# Patient Record
Sex: Male | Born: 1950
Health system: Southern US, Community
[De-identification: ages and names within clinical notes are randomized; demographics above are authoritative.]

## PROBLEM LIST (undated history)

## (undated) DIAGNOSIS — N201 Calculus of ureter: Secondary | ICD-10-CM

## (undated) DIAGNOSIS — Z87442 Personal history of urinary calculi: Secondary | ICD-10-CM

## (undated) DIAGNOSIS — I10 Essential (primary) hypertension: Secondary | ICD-10-CM

## (undated) DIAGNOSIS — N200 Calculus of kidney: Secondary | ICD-10-CM

## (undated) DIAGNOSIS — K759 Inflammatory liver disease, unspecified: Secondary | ICD-10-CM

## (undated) DIAGNOSIS — K5792 Diverticulitis of intestine, part unspecified, without perforation or abscess without bleeding: Secondary | ICD-10-CM

## (undated) DIAGNOSIS — E785 Hyperlipidemia, unspecified: Secondary | ICD-10-CM

## (undated) DIAGNOSIS — C801 Malignant (primary) neoplasm, unspecified: Secondary | ICD-10-CM

## (undated) HISTORY — PX: TONSILLECTOMY: SUR1361

## (undated) HISTORY — PX: EXTRACORPOREAL SHOCK WAVE LITHOTRIPSY: SHX1557

---

## 2002-04-30 DIAGNOSIS — C4492 Squamous cell carcinoma of skin, unspecified: Secondary | ICD-10-CM

## 2002-04-30 HISTORY — DX: Squamous cell carcinoma of skin, unspecified: C44.92

## 2004-10-25 ENCOUNTER — Ambulatory Visit (HOSPITAL_COMMUNITY): Admission: RE | Admit: 2004-10-25 | Discharge: 2004-10-25 | Payer: Self-pay | Admitting: Family Medicine

## 2006-09-04 ENCOUNTER — Encounter (INDEPENDENT_AMBULATORY_CARE_PROVIDER_SITE_OTHER): Payer: Self-pay | Admitting: Surgery

## 2006-09-05 ENCOUNTER — Ambulatory Visit (HOSPITAL_COMMUNITY): Admission: RE | Admit: 2006-09-05 | Discharge: 2006-09-05 | Payer: Self-pay | Admitting: Surgery

## 2006-09-05 HISTORY — PX: LAPAROSCOPIC CHOLECYSTECTOMY: SUR755

## 2006-10-09 DIAGNOSIS — C4491 Basal cell carcinoma of skin, unspecified: Secondary | ICD-10-CM

## 2006-10-09 HISTORY — DX: Basal cell carcinoma of skin, unspecified: C44.91

## 2006-10-24 ENCOUNTER — Ambulatory Visit (HOSPITAL_COMMUNITY): Admission: RE | Admit: 2006-10-24 | Discharge: 2006-10-24 | Payer: Self-pay | Admitting: Urology

## 2006-11-25 DIAGNOSIS — C4491 Basal cell carcinoma of skin, unspecified: Secondary | ICD-10-CM

## 2006-11-25 HISTORY — DX: Basal cell carcinoma of skin, unspecified: C44.91

## 2009-09-26 DIAGNOSIS — C4491 Basal cell carcinoma of skin, unspecified: Secondary | ICD-10-CM

## 2009-09-26 HISTORY — DX: Basal cell carcinoma of skin, unspecified: C44.91

## 2010-07-11 NOTE — Op Note (Signed)
NAME:  Gary Hurst, Gary Hurst             ACCOUNT NO.:  [id number]   MEDICAL RECORD NO.:  [id number]          PATIENT TYPE:  AMB   LOCATION:  SDS                          FACILITY:  [facility]   PHYSICIAN:  Wilmon Arms. Corliss Skains, M.D. DATE OF BIRTH:  Dec 15, 1950   DATE OF PROCEDURE:  09/05/2006  DATE OF DISCHARGE:                               OPERATIVE REPORT   PREOPERATIVE DIAGNOSIS:  Chronic cholecystitis with biliary colic.   POSTOPERATIVE DIAGNOSIS:  Chronic cholecystitis with biliary colic.   PROCEDURE PERFORMED:  Laparoscopic cholecystectomy with intraoperative  cholangiogram.   SURGEON:  Wilmon Arms. Corliss Skains, M.D.   ASSISTANT:  Velora Heckler, MD   ANESTHESIA:  General endotracheal.   INDICATIONS:  The patient is a 60 year old male who presents with a one  month history of right upper quadrant pain.  This has become more  severe, more constant over time.  It is located in his right upper  quadrant  and radiates through to his back.  He has some nausea,  bloating and belching.  An ultrasound showed no gallbladder wall  thickening but did show some gallbladder sludge.  He had normal liver  functions, amylase and lipase.  He was examined the office and it was  felt that his symptoms were due to his gallbladder.  We recommended  laparoscopic cholecystectomy.   DESCRIPTION OF PROCEDURE:  The patient is brought to the operating room,  placed in supine position on the operating room table.  After an  adequate level of general anesthesia was obtained, the patient's abdomen  was shaved, prepped with Betadine and draped in sterile fashion.  Time-  out was taken assure proper patient, proper procedure.  The area around  the umbilicus was infiltrated with 0.25% Marcaine.  A transverse  incision was made below the umbilicus.  Dissection was carried down to  the fascia which was opened vertically.  The peritoneum was bluntly  entered.  A stay suture of Vicryl was placed around the fascial opening.  The  Hasson cannula was inserted and secured with a stay suture.  Pneumoperitoneum is obtained by insufflating CO2, maintaining maximal  pressure of 15 mm/Hg.  The laparoscope was inserted and the patient was  positioned in reverse Trendelenburg and tilted to his left.  A 10 mm  port was placed in the subxiphoid position.  Two 5 mm ports were placed  in right upper quadrant.  The gallbladder was grasped with a clamp,  elevated over the edge of the liver.  We opened the peritoneum around  the hilum of the gallbladder.  The cystic duct was identified.  We  circumferentially dissected around the duct.  A clip was placed distally  on the duct.  The cystic duct was opened with scissors.  A Cook  cholangiogram catheter was inserted through a stab incision and threaded  into the cystic duct.  It was secured with a clip.  A cholangiogram was  obtained.  This showed good flow proximally and distally in the biliary  tree with no sign of obstruction.  Contrast flowed easily into the  duodenum.  The catheter was then removed.  The cystic duct was ligated  with clips and divided.  The cystic artery had two branches and both  were ligated with clips and divided.  Cautery was then used to remove  the gallbladder from the liver bed.  The gallbladder was placed in an  EndoCatch sac.  We irrigated the gallbladder fossa.  Hemostasis was  obtained with cautery.  The gallbladder was then removed through the  umbilical port site.  The fascia was closed with the stay suture.  Pneumoperitoneum is then released as the remainder of the  ports were removed.  4-0 Monocryl was used to close all skin incisions.  Steri-Strips and clean dressings were applied.  The patient was then  extubated and brought to recovery room in stable condition.  All sponge,  instrument, needle counts correct.      Wilmon Arms. Tsuei, M.D.  Electronically Signed     MKT/MEDQ  D:  09/05/2006  T:  09/05/2006  Job:  [id number]   cc:   Sigmund Hazel, M.D.

## 2010-12-08 LAB — CBC
HCT: 43.9
Hemoglobin: 15.5
MCHC: 35.2
MCV: 87.5
Platelets: 216
RBC: 5.02
RDW: 13.2

## 2010-12-08 LAB — BASIC METABOLIC PANEL
Potassium: 4
Sodium: 136

## 2010-12-12 LAB — COMPREHENSIVE METABOLIC PANEL
AST: 19
BUN: 13
CO2: 29
Chloride: 100
GFR calc Af Amer: >60
Sodium: 137
Total Bilirubin: 1.1

## 2010-12-12 LAB — DIFFERENTIAL
Eosinophils Absolute: 0.1
Eosinophils Relative: 1
Monocytes Absolute: 0.6
Neutro Abs: 4.2
Neutrophils Relative %: 52

## 2010-12-12 LAB — CBC
HCT: 42.8
Hemoglobin: 14.7
MCV: 88.9
Platelets: 198
RBC: 4.82
RDW: 12.7

## 2011-01-25 ENCOUNTER — Other Ambulatory Visit: Payer: Self-pay | Admitting: Dermatology

## 2012-04-18 ENCOUNTER — Telehealth: Payer: Self-pay | Admitting: *Deleted

## 2012-04-18 NOTE — Telephone Encounter (Signed)
Erroneous encounter

## 2012-06-18 ENCOUNTER — Other Ambulatory Visit: Payer: Self-pay | Admitting: Dermatology

## 2012-06-18 DIAGNOSIS — C4492 Squamous cell carcinoma of skin, unspecified: Secondary | ICD-10-CM

## 2012-06-18 HISTORY — DX: Squamous cell carcinoma of skin, unspecified: C44.92

## 2012-07-28 ENCOUNTER — Other Ambulatory Visit: Payer: Self-pay | Admitting: Dermatology

## 2012-08-29 ENCOUNTER — Emergency Department (HOSPITAL_COMMUNITY): Payer: BC Managed Care – PPO

## 2012-08-29 ENCOUNTER — Encounter (HOSPITAL_COMMUNITY): Payer: Self-pay

## 2012-08-29 ENCOUNTER — Emergency Department (HOSPITAL_COMMUNITY)
Admission: EM | Admit: 2012-08-29 | Discharge: 2012-08-29 | Disposition: A | Discharge status: Home / Self Care | Payer: BC Managed Care – PPO | Attending: Emergency Medicine | Admitting: Emergency Medicine

## 2012-08-29 DIAGNOSIS — Z87891 Personal history of nicotine dependence: Secondary | ICD-10-CM | POA: Insufficient documentation

## 2012-08-29 DIAGNOSIS — Z7982 Long term (current) use of aspirin: Secondary | ICD-10-CM | POA: Insufficient documentation

## 2012-08-29 DIAGNOSIS — I10 Essential (primary) hypertension: Secondary | ICD-10-CM | POA: Insufficient documentation

## 2012-08-29 DIAGNOSIS — N200 Calculus of kidney: Secondary | ICD-10-CM

## 2012-08-29 DIAGNOSIS — E785 Hyperlipidemia, unspecified: Secondary | ICD-10-CM | POA: Insufficient documentation

## 2012-08-29 DIAGNOSIS — Z79899 Other long term (current) drug therapy: Secondary | ICD-10-CM | POA: Insufficient documentation

## 2012-08-29 DIAGNOSIS — Z87442 Personal history of urinary calculi: Secondary | ICD-10-CM | POA: Insufficient documentation

## 2012-08-29 HISTORY — DX: Essential (primary) hypertension: I10

## 2012-08-29 LAB — POCT I-STAT, CHEM 8
BUN: 19 mg/dL (ref 6–23)
Calcium, Ion: 1.14 mmol/L (ref 1.12–1.23)
Chloride: 102 mEq/L (ref 96–112)
Creatinine, Ser: 1.1 mg/dL (ref 0.50–1.35)
Glucose, Bld: 96 mg/dL (ref 70–99)
Potassium: 4.1 mEq/L (ref 3.5–5.1)
TCO2: 23 mmol/L (ref 0–100)

## 2012-08-29 LAB — URINALYSIS, ROUTINE W REFLEX MICROSCOPIC
Ketones, ur: NEGATIVE mg/dL
Nitrite: NEGATIVE
Protein, ur: NEGATIVE mg/dL
Specific Gravity, Urine: 1.027 (ref 1.005–1.030)
Urobilinogen, UA: 1 mg/dL (ref 0.0–1.0)
pH: 6 (ref 5.0–8.0)

## 2012-08-29 LAB — URINE MICROSCOPIC-ADD ON

## 2012-08-29 MED ORDER — ONDANSETRON HCL 4 MG PO TABS: 4.0000 mg | ORAL_TABLET | Freq: Four times a day (QID) | ORAL | Status: DC

## 2012-08-29 MED ORDER — TAMSULOSIN HCL 0.4 MG PO CAPS: 0.4000 mg | ORAL_CAPSULE | Freq: Every day | ORAL | Status: DC

## 2012-08-29 MED ORDER — OXYCODONE-ACETAMINOPHEN 5-325 MG PO TABS: 2.0000 | ORAL_TABLET | ORAL | Status: DC | PRN

## 2012-08-29 MED ORDER — OXYCODONE-ACETAMINOPHEN 5-325 MG PO TABS
1.0000 | ORAL_TABLET | Freq: Once | ORAL | Status: AC
  Administered 2012-08-29: 1 via ORAL
  Filled 2012-08-29: qty 1

## 2012-08-29 NOTE — ED Notes (Signed)
Pt aware of the need for urine sample.  Urinal at bedside

## 2012-08-29 NOTE — Consult Note (Signed)
Reason for Consult: Rt Ureteral Stone, Bilateral Intrarenal Stones, Left Renal Cyst  Referring Physician: Laveda Hurst, [place]  Gary Hurst is an 62 y.o. male.  HPI:   1 - Rt Ureteral Stone, Bilateral Renal Stones - Pt with Rt mid ureteral stone (7mm, 1200HU, SSD 13cm) as well as Rt>Lt intrarenal stones by CT in ER 7/4 on work-up of colicky rt flank pain. Has had numerous prior stones managed with medical therapy x 5 and lithotripsy x 2 previously. He is on K-Cit BID and drinks at least 1 gallon of water daily and has followed previously with Sabino Gasser in [facility].   No fever / chills / nausea / vomitting. Pain now well controlled in ER. UA without infectious parameters.  2 - Left Renal Cyst - 2.5cm left mid lateral renal cyst incidental on CT stone study 08/29/12. Questionable wall thickening on severel cuts, no prior contrast renal imaging.   3 - Prostate Screening - Pt's father with prostate cancer treated with brachytherapy. Pt gets annual DRE and PSA per PCP and normal per report.  Pt's PCP is Gary Gosselin MD at [facility] [place].  Today Gary Hurst is seen as ER consult for above.   Past Medical History  Diagnosis Date  . Kidney stones   . Kidney stones   . Hypertension   . High cholesterol     Past Surgical History  Procedure Laterality Date  . Cholecystectomy      Family History  Problem Relation Age of Onset  . Parkinson's disease Mother   . Hypertension Mother   . Hyperlipidemia Mother   . Kidney Stones Father   . Alzheimer's disease Father   . Kidney Stones Sister   . Kidney Stones Brother     Social History:  reports that he has quit smoking. He has never used smokeless tobacco. He reports that he does not drink alcohol or use illicit drugs.  Allergies:  Allergies  Allergen Reactions  . Penicillins Rash    Medications: I have reviewed the patient's current medications.  Results for orders placed during the hospital encounter of 08/29/12 (from the past 48 hour(s))   URINALYSIS, ROUTINE W REFLEX MICROSCOPIC     Status: Abnormal   Collection Time    08/29/12 11:18 AM      Result Value Range   Color, Urine YELLOW  YELLOW   APPearance CLOUDY (*) CLEAR   Specific Gravity, Urine 1.027  1.005 - 1.030   pH 6.0  5.0 - 8.0   Glucose, UA NEGATIVE  NEGATIVE mg/dL   Hgb urine dipstick LARGE (*) NEGATIVE   Bilirubin Urine NEGATIVE  NEGATIVE   Ketones, ur NEGATIVE  NEGATIVE mg/dL   Protein, ur NEGATIVE  NEGATIVE mg/dL   Urobilinogen, UA 1.0  0.0 - 1.0 mg/dL   Nitrite NEGATIVE  NEGATIVE   Leukocytes, UA TRACE (*) NEGATIVE  URINE MICROSCOPIC-ADD ON     Status: None   Collection Time    08/29/12 11:18 AM      Result Value Range   WBC, UA 3-6  <3 WBC/hpf   RBC / HPF TOO NUMEROUS TO COUNT  <3 RBC/hpf  POCT I-STAT, CHEM 8     Status: None   Collection Time    08/29/12  1:26 PM      Result Value Range   Sodium 138  135 - 145 mEq/L   Potassium 4.1  3.5 - 5.1 mEq/L   Chloride 102  96 - 112 mEq/L   BUN 19  6 -  23 mg/dL   Creatinine, Ser 1.61  0.50 - 1.35 mg/dL   Glucose, Bld 96  70 - 99 mg/dL   Calcium, Ion 0.96  0.45 - 1.23 mmol/L   TCO2 23  0 - 100 mmol/L   Hemoglobin 14.6  13.0 - 17.0 g/dL   HCT 40.9  81.1 - 91.4 %    Ct Abdomen Pelvis Wo Contrast  08/29/2012   *RADIOLOGY REPORT*  Clinical Data: Right flank pain  CT ABDOMEN AND PELVIS WITHOUT CONTRAST  Technique:  Multidetector CT imaging of the abdomen and pelvis was performed following the standard protocol without intravenous contrast.  Comparison: 04/26/2011  Findings: Lung bases are clear.  No pleural or pericardial effusion.  Mild diffuse fatty infiltration of the liver noted.  There are multiple liver cysts identified.  Previous cholecystectomy.  No biliary dilatation.  The pancreas is unremarkable.  The spleen is normal.  The adrenal glands are both normal.  Bilateral renal calculi are identified. There is right-sided hydronephrosis and hydroureter.  Within the mid right ureter there is a stone  measuring 7 mm, image 44/series 2.  No left-sided hydronephrosis or hydroureter.  There is a cyst within the mid pole of the left kidney measuring 2.8 cm.  This is incompletely characterized without IV contrast.  Tiny calcification is noted within the right posterior bladder base, image 71/series 2.  There are calcifications noted within the prostate gland.  Calcified atherosclerotic disease affects the abdominal aorta and its branches.  No aneurysm.  There is no upper abdominal adenopathy noted.  There is no pelvic or inguinal adenopathy identified.  The stomach and small bowel loops have a normal course and caliber. No evidence for bowel obstruction.  The appendix is visualized and appears normal.  Scattered colonic diverticula noted without acute inflammation.  Review of the visualized osseous structures is significant for mild multilevel degenerative disc disease.  IMPRESSION:  1.  Right-sided hydronephrosis and proximal hydroureter.  There is a stone within the mid right ureter measuring 7 mm. 2.  Bilateral nephrolithiasis. 3.  Liver and kidneys cysts. 4.  Prior cholecystectomy. 5.  Hepatic steatosis. 6.  Atherosclerosis.   Original Report Authenticated By: Signa Kell, M.D.    Review of Systems  Constitutional: Negative.   HENT: Negative.   Eyes: Negative.   Respiratory: Negative.   Cardiovascular: Negative.   Gastrointestinal: Negative.  Negative for nausea and vomiting.  Genitourinary: Positive for flank pain.  Musculoskeletal: Negative.   Skin: Negative.   Neurological: Negative.   Endo/Heme/Allergies: Negative.   Psychiatric/Behavioral: Negative.    Blood pressure 128/80, pulse 53, temperature 98.1 F (36.7 C), temperature source Oral, resp. rate 18, height 5\' 9"  (1.753 m), weight 90.719 kg (200 lb), SpO2 97.00%. Physical Exam  Constitutional: He is oriented to person, place, and time. He appears well-developed and well-nourished.  Wife at bedside  HENT:  Head: Normocephalic and  atraumatic.  Eyes: EOM are normal. Pupils are equal, round, and reactive to light.  Neck: Normal range of motion. Neck supple.  Cardiovascular: Normal rate.   Respiratory: Effort normal.  GI: Soft. Bowel sounds are normal.  Genitourinary: Penis normal.  Mild Rt CVAT  Musculoskeletal: Normal range of motion.  Neurological: He is alert and oriented to person, place, and time.  Skin: Skin is warm and dry.  Psychiatric: He has a normal mood and affect. His behavior is normal. Judgment and thought content normal.    Assessment/Plan:  1 - Rt Ureteral Stone, Bilateral Renal Stones - Pain now  well controlled, no infectious parameters, he has opted for elective management as outpatient. Management options were presented in order of increasing efficacy including medical therapy, shockwave lithotripsy, and ureteroscopy with goals of treating ureteral stone only v. Unilateral clean-out v. Bilateral clean out. He has opted for Rt ureteroscopy with unilateral clean out. I will arrange this as an outpatient in the next few weeks. He will receive medical therapy in interval.  2 - Left Renal Cyst - Incompletely characterized by on-con CT. We will obtain dedicated contrast axial imaging at later date after stone surgery. We discussed DDx of simple cyst v. Cystic tumor.  3 - Prostate Screening - up to date this year.  CC: Gary Gosselin MD, [facility] at [facility].  Arye Weyenberg 08/29/2012, 2:06 PM

## 2012-08-29 NOTE — ED Notes (Signed)
Patient c/o right flank pain and patient states he was told by a urologist that he had 2 stones 2 years ago.

## 2012-08-29 NOTE — ED Notes (Signed)
Pt returned from CT via stretcher with tech

## 2012-08-29 NOTE — ED Notes (Signed)
Pt transported to CT via stretcher with tech.  

## 2012-08-29 NOTE — ED Provider Notes (Signed)
History    CSN: [id number] Arrival date & time 08/29/12  1039  First MD Initiated Contact with Patient 08/29/12 1105     Chief Complaint  Patient presents with  . Nephrolithiasis   (Consider location/radiation/quality/duration/timing/severity/associated sxs/prior Treatment) HPI  62 year old male with history of kidney stones, hypertension, and high cholesterol presents complaining of right flank pain. Patient reports acute onset of sharp stabbing pain to his right flank and radiates to his right groin. Pain has been waxing and waning and improve after taking Tylox at midnight. Pain is currently 8/10. Yesterday he was nausea and vomited twice due to the intensity of the pain but nausea has resolved. Nothing seems to make the pain worse. Denies fever, chills, chest pain, shortness of breath, abdominal pain, dysuria, hematuria, penile discharge, scrotal swelling or testicular pain. State pain feels similar to prior kidney stones. Patient report having 14 kidney stones in the past. Last CT scan was 2 years ago and states there was two retained stones in his right kidney. Patient reports 2 prior lithotripsy procedure but no prior stenting. Does not have a local urologist  Past Medical History  Diagnosis Date  . Kidney stones   . Kidney stones   . Hypertension   . High cholesterol    Past Surgical History  Procedure Laterality Date  . Cholecystectomy     Family History  Problem Relation Age of Onset  . Parkinson's disease Mother   . Hypertension Mother   . Hyperlipidemia Mother   . Kidney Stones Father   . Alzheimer's disease Father   . Kidney Stones Sister   . Kidney Stones Brother    History  Substance Use Topics  . Smoking status: Former Games developer  . Smokeless tobacco: Never Used  . Alcohol Use: No    Review of Systems  Constitutional: Negative for fever.  Genitourinary: Positive for flank pain. Negative for dysuria and hematuria.  Skin: Negative for rash.  All other  systems reviewed and are negative.    Allergies  Penicillins  Home Medications   Current Outpatient Rx  Name  Route  Sig  Dispense  Refill  . aspirin EC 81 MG tablet   Oral   Take 81 mg by mouth daily.         Marland Kitchen atorvastatin (LIPITOR) 40 MG tablet   Oral   Take 40 mg by mouth daily.         Marland Kitchen oxyCODONE-acetaminophen (TYLOX) 5-500 MG per capsule   Oral   Take 1 capsule by mouth every 4 (four) hours as needed for pain.         . potassium citrate (UROCIT-K) 10 MEQ (1080 MG) SR tablet   Oral   Take 10 mEq by mouth 2 (two) times daily.         . ramipril (ALTACE) 10 MG capsule   Oral   Take 10 mg by mouth daily.          BP 142/90  Pulse 56  Temp(Src) 98.1 F (36.7 C) (Oral)  Resp 20  Ht 5\' 9"  (1.753 m)  Wt 200 lb (90.719 kg)  BMI 29.52 kg/m2  SpO2 94% Physical Exam  Nursing note and vitals reviewed. Constitutional: He is oriented to person, place, and time. He appears well-developed and well-nourished. No distress (Patient appears comfortable laying in bed.).  HENT:  Head: Atraumatic.  Eyes: Conjunctivae are normal.  Neck: Neck supple.  Cardiovascular: Normal rate and regular rhythm.   Pulmonary/Chest: Effort normal and breath sounds  normal.  Abdominal: Soft. There is tenderness (Mild tenderness to right lateral midabdomen. No Murphy sign, no McBurney's point.).  Genitourinary:  Right CVA tenderness on percussion  Testicle nontender, no scrotal swelling, no inguinal hernia noted  Neurological: He is alert and oriented to person, place, and time.  Skin: Skin is warm. No rash noted.  Psychiatric: He has a normal mood and affect.    ED Course  Procedures (including critical care time)  11:19 AM R flank pain, felt similar to kidney stones he has in the past.  Pt however does not have any documented CT scan in our system.  Since pain is similar, will give pain meds, check UA, obtain abd/pelvic CT wo CM.  Doubt hernia, doubt appendicitis, or  cholecystitis.    2:13 PM UA shows moderate hemoglobin but no signs of UTI. CT scan shows evidence of a 7 mm obstruction stone to the mid right ureter with associate hydronephrosis and stranding. Patient however states pain is well controlled and he feels better. I have consult with urologist, Dr. Urban Gibson, who has seen and evaluate patient and plan to have surgical procedure to remove the stone outpatient. Patient now stable for discharge. Return precautions discussed.  Labs Reviewed  URINALYSIS, ROUTINE W REFLEX MICROSCOPIC - Abnormal; Notable for the following:    APPearance CLOUDY (*)    Hgb urine dipstick LARGE (*)    Leukocytes, UA TRACE (*)    All other components within normal limits  URINE MICROSCOPIC-ADD ON  POCT I-STAT, CHEM 8   Ct Abdomen Pelvis Wo Contrast  08/29/2012   *RADIOLOGY REPORT*  Clinical Data: Right flank pain  CT ABDOMEN AND PELVIS WITHOUT CONTRAST  Technique:  Multidetector CT imaging of the abdomen and pelvis was performed following the standard protocol without intravenous contrast.  Comparison: 04/26/2011  Findings: Lung bases are clear.  No pleural or pericardial effusion.  Mild diffuse fatty infiltration of the liver noted.  There are multiple liver cysts identified.  Previous cholecystectomy.  No biliary dilatation.  The pancreas is unremarkable.  The spleen is normal.  The adrenal glands are both normal.  Bilateral renal calculi are identified. There is right-sided hydronephrosis and hydroureter.  Within the mid right ureter there is a stone measuring 7 mm, image 44/series 2.  No left-sided hydronephrosis or hydroureter.  There is a cyst within the mid pole of the left kidney measuring 2.8 cm.  This is incompletely characterized without IV contrast.  Tiny calcification is noted within the right posterior bladder base, image 71/series 2.  There are calcifications noted within the prostate gland.  Calcified atherosclerotic disease affects the abdominal aorta and its branches.   No aneurysm.  There is no upper abdominal adenopathy noted.  There is no pelvic or inguinal adenopathy identified.  The stomach and small bowel loops have a normal course and caliber. No evidence for bowel obstruction.  The appendix is visualized and appears normal.  Scattered colonic diverticula noted without acute inflammation.  Review of the visualized osseous structures is significant for mild multilevel degenerative disc disease.  IMPRESSION:  1.  Right-sided hydronephrosis and proximal hydroureter.  There is a stone within the mid right ureter measuring 7 mm. 2.  Bilateral nephrolithiasis. 3.  Liver and kidneys cysts. 4.  Prior cholecystectomy. 5.  Hepatic steatosis. 6.  Atherosclerosis.   Original Report Authenticated By: Signa Kell, M.D.   1. Kidney stone on right side     MDM  BP 128/80  Pulse 53  Temp(Src) 98.1 F (  36.7 C) (Oral)  Resp 18  Ht 5\' 9"  (1.753 m)  Wt 200 lb (90.719 kg)  BMI 29.52 kg/m2  SpO2 97%  I have reviewed nursing notes and vital signs. I personally reviewed the imaging tests through PACS system  I reviewed available ER/hospitalization records thought the EMR   Fayrene Helper, PA-C 08/29/12 1414

## 2012-08-29 NOTE — ED Provider Notes (Signed)
Medical screening examination/treatment/procedure(s) were performed by non-physician practitioner and as supervising physician I was immediately available for consultation/collaboration.  Doug Sou, MD 08/29/12 [phone]

## 2012-08-29 NOTE — ED Notes (Signed)
Urology MD at bedside

## 2012-08-29 NOTE — ED Notes (Signed)
Patient unable to void at this time

## 2012-09-02 ENCOUNTER — Other Ambulatory Visit: Payer: Self-pay | Admitting: Urology

## 2012-09-02 ENCOUNTER — Encounter (HOSPITAL_BASED_OUTPATIENT_CLINIC_OR_DEPARTMENT_OTHER): Payer: Self-pay | Admitting: *Deleted

## 2012-09-02 NOTE — Progress Notes (Signed)
NPO AFTER MN. ARRIVES AT 1030. CURRENT ISTAT 8 IN EPIC AND CHART. NEEDS EKG. MAY TAKE OXYCODONE/ ZOFRAN IF NEEDED W/ SIPS OF WATER.

## 2012-09-04 ENCOUNTER — Ambulatory Visit (HOSPITAL_BASED_OUTPATIENT_CLINIC_OR_DEPARTMENT_OTHER): Payer: BC Managed Care – PPO | Admitting: Anesthesiology

## 2012-09-04 ENCOUNTER — Encounter (HOSPITAL_BASED_OUTPATIENT_CLINIC_OR_DEPARTMENT_OTHER): Payer: Self-pay | Admitting: *Deleted

## 2012-09-04 ENCOUNTER — Encounter (HOSPITAL_BASED_OUTPATIENT_CLINIC_OR_DEPARTMENT_OTHER): Payer: Self-pay | Admitting: Anesthesiology

## 2012-09-04 ENCOUNTER — Ambulatory Visit (HOSPITAL_COMMUNITY): Payer: BC Managed Care – PPO

## 2012-09-04 ENCOUNTER — Ambulatory Visit (HOSPITAL_BASED_OUTPATIENT_CLINIC_OR_DEPARTMENT_OTHER)
Admission: RE | Admit: 2012-09-04 | Discharge: 2012-09-04 | Disposition: A | Discharge status: Home / Self Care | Payer: BC Managed Care – PPO | Source: Ambulatory Visit | Attending: Urology | Admitting: Urology

## 2012-09-04 ENCOUNTER — Encounter (HOSPITAL_BASED_OUTPATIENT_CLINIC_OR_DEPARTMENT_OTHER)
Admission: RE | Disposition: A | Discharge status: Home / Self Care | Payer: Self-pay | Source: Ambulatory Visit | Attending: Urology

## 2012-09-04 DIAGNOSIS — E785 Hyperlipidemia, unspecified: Secondary | ICD-10-CM | POA: Insufficient documentation

## 2012-09-04 DIAGNOSIS — N200 Calculus of kidney: Secondary | ICD-10-CM | POA: Insufficient documentation

## 2012-09-04 DIAGNOSIS — N201 Calculus of ureter: Secondary | ICD-10-CM | POA: Insufficient documentation

## 2012-09-04 DIAGNOSIS — I10 Essential (primary) hypertension: Secondary | ICD-10-CM | POA: Insufficient documentation

## 2012-09-04 HISTORY — PX: CYSTOSCOPY WITH RETROGRADE PYELOGRAM, URETEROSCOPY AND STENT PLACEMENT: SHX5789

## 2012-09-04 HISTORY — DX: Calculus of ureter: N20.1

## 2012-09-04 HISTORY — DX: Personal history of urinary calculi: Z87.442

## 2012-09-04 HISTORY — DX: Hyperlipidemia, unspecified: E78.5

## 2012-09-04 HISTORY — DX: Calculus of kidney: N20.0

## 2012-09-04 HISTORY — PX: HOLMIUM LASER APPLICATION: SHX5852

## 2012-09-04 LAB — POCT I-STAT, CHEM 8
Calcium, Ion: 1.22 mmol/L (ref 1.12–1.23)
Glucose, Bld: 98 mg/dL (ref 70–99)
HCT: 42 % (ref 39.0–52.0)
Hemoglobin: 14.3 g/dL (ref 13.0–17.0)

## 2012-09-04 SURGERY — CYSTOURETEROSCOPY, WITH RETROGRADE PYELOGRAM AND STENT INSERTION
Anesthesia: General | Site: Ureter | Laterality: Right | Wound class: Clean Contaminated

## 2012-09-04 MED ORDER — SODIUM CHLORIDE 0.9 % IR SOLN
Status: DC | PRN
  Administered 2012-09-04: 6000 mL

## 2012-09-04 MED ORDER — FENTANYL CITRATE 0.05 MG/ML IJ SOLN
25.0000 ug | INTRAMUSCULAR | Status: DC | PRN
  Filled 2012-09-04: qty 1

## 2012-09-04 MED ORDER — LACTATED RINGERS IV SOLN
INTRAVENOUS | Status: DC
  Administered 2012-09-04 (×2): via INTRAVENOUS
  Filled 2012-09-04: qty 1000

## 2012-09-04 MED ORDER — LACTATED RINGERS IV SOLN
INTRAVENOUS | Status: DC
  Filled 2012-09-04: qty 1000

## 2012-09-04 MED ORDER — FENTANYL CITRATE 0.05 MG/ML IJ SOLN
INTRAMUSCULAR | Status: DC | PRN
  Administered 2012-09-04: 100 ug via INTRAVENOUS
  Administered 2012-09-04 (×2): 50 ug via INTRAVENOUS

## 2012-09-04 MED ORDER — ONDANSETRON HCL 4 MG/2ML IJ SOLN
INTRAMUSCULAR | Status: DC | PRN
  Administered 2012-09-04: 4 mg via INTRAVENOUS

## 2012-09-04 MED ORDER — OXYCODONE-ACETAMINOPHEN 5-325 MG PO TABS: 1.0000 | ORAL_TABLET | ORAL | Status: DC | PRN

## 2012-09-04 MED ORDER — PROPOFOL 10 MG/ML IV BOLUS
INTRAVENOUS | Status: DC | PRN
  Administered 2012-09-04: 200 mg via INTRAVENOUS
  Administered 2012-09-04 (×2): 100 mg via INTRAVENOUS

## 2012-09-04 MED ORDER — KETOROLAC TROMETHAMINE 30 MG/ML IJ SOLN
INTRAMUSCULAR | Status: DC | PRN
  Administered 2012-09-04: 30 mg via INTRAVENOUS

## 2012-09-04 MED ORDER — SULFAMETHOXAZOLE-TMP DS 800-160 MG PO TABS: 1.0000 | ORAL_TABLET | Freq: Every day | ORAL | Status: DC

## 2012-09-04 MED ORDER — SENNOSIDES-DOCUSATE SODIUM 8.6-50 MG PO TABS: 1.0000 | ORAL_TABLET | Freq: Two times a day (BID) | ORAL | Status: DC

## 2012-09-04 MED ORDER — PROMETHAZINE HCL 25 MG/ML IJ SOLN
6.2500 mg | INTRAMUSCULAR | Status: DC | PRN
  Filled 2012-09-04: qty 1

## 2012-09-04 MED ORDER — LIDOCAINE HCL (CARDIAC) 20 MG/ML IV SOLN
INTRAVENOUS | Status: DC | PRN
  Administered 2012-09-04: 100 mg via INTRAVENOUS

## 2012-09-04 MED ORDER — GENTAMICIN IN SALINE 1.6-0.9 MG/ML-% IV SOLN
80.0000 mg | INTRAVENOUS | Status: AC
  Administered 2012-09-04: 80 mg via INTRAVENOUS
  Filled 2012-09-04: qty 50

## 2012-09-04 MED ORDER — MIDAZOLAM HCL 5 MG/5ML IJ SOLN
INTRAMUSCULAR | Status: DC | PRN
  Administered 2012-09-04: 2 mg via INTRAVENOUS

## 2012-09-04 MED ORDER — MEPERIDINE HCL 25 MG/ML IJ SOLN
6.2500 mg | INTRAMUSCULAR | Status: DC | PRN
  Filled 2012-09-04: qty 1

## 2012-09-04 MED ORDER — IOHEXOL 300 MG/ML  SOLN: INTRAMUSCULAR | Status: DC | PRN

## 2012-09-04 MED ORDER — IOHEXOL 350 MG/ML SOLN
INTRAVENOUS | Status: DC | PRN
  Administered 2012-09-04: 17 mL via URETHRAL

## 2012-09-04 MED ORDER — DEXAMETHASONE SODIUM PHOSPHATE 4 MG/ML IJ SOLN
INTRAMUSCULAR | Status: DC | PRN
  Administered 2012-09-04: 10 mg via INTRAVENOUS

## 2012-09-04 SURGICAL SUPPLY — 45 items
ADAPTER CATH URET PLST 4-6FR (CATHETERS) IMPLANT
ADPR CATH URET STRL DISP 4-6FR (CATHETERS)
BAG DRAIN URO-CYSTO SKYTR STRL (DRAIN) ×2 IMPLANT
BAG DRN UROCATH (DRAIN) ×1
BAG URO CATCHER STRL LF (DRAPE) ×2 IMPLANT
BASKET LASER NITINOL 1.9FR (BASKET) ×2 IMPLANT
BASKET STNLS GEMINI 4WIRE 3FR (BASKET) IMPLANT
BASKET ZERO TIP NITINOL 2.4FR (BASKET) IMPLANT
BRUSH URET BIOPSY 3F (UROLOGICAL SUPPLIES) IMPLANT
BSKT STON RTRVL 120 1.9FR (BASKET) ×1
BSKT STON RTRVL GEM 120X11 3FR (BASKET)
BSKT STON RTRVL ZERO TP 2.4FR (BASKET)
CANISTER SUCT LVC 12 LTR MEDI- (MISCELLANEOUS) ×1 IMPLANT
CATH FOLEY 2WAY  3CC  8FR (CATHETERS)
CATH FOLEY 2WAY 3CC 8FR (CATHETERS) IMPLANT
CATH INTERMIT  6FR 70CM (CATHETERS) ×1 IMPLANT
CATH URET 5FR 28IN CONE TIP (BALLOONS)
CATH URET 5FR 28IN OPEN ENDED (CATHETERS) IMPLANT
CATH URET 5FR 70CM CONE TIP (BALLOONS) IMPLANT
CLOTH BEACON ORANGE TIMEOUT ST (SAFETY) ×2 IMPLANT
DRAPE CAMERA CLOSED 9X96 (DRAPES) ×2 IMPLANT
ELECT REM PT RETURN 9FT ADLT (ELECTROSURGICAL)
ELECTRODE REM PT RTRN 9FT ADLT (ELECTROSURGICAL) IMPLANT
GLOVE BIO SURGEON STRL SZ7.5 (GLOVE) ×2 IMPLANT
GLOVE BIOGEL M 6.5 STRL (GLOVE) ×1 IMPLANT
GLOVE BIOGEL M STER SZ 6 (GLOVE) ×1 IMPLANT
GOWN PREVENTION PLUS LG XLONG (DISPOSABLE) ×3 IMPLANT
GOWN STRL REIN XL XLG (GOWN DISPOSABLE) ×2 IMPLANT
GUIDEWIRE 0.038 PTFE COATED (WIRE) IMPLANT
GUIDEWIRE ANG ZIPWIRE 038X150 (WIRE) ×2 IMPLANT
GUIDEWIRE STR DUAL SENSOR (WIRE) ×2 IMPLANT
IV NS IRRIG 3000ML ARTHROMATIC (IV SOLUTION) ×6 IMPLANT
KIT BALLIN UROMAX 15FX10 (LABEL) IMPLANT
KIT BALLN UROMAX 15FX4 (MISCELLANEOUS) IMPLANT
KIT BALLN UROMAX 26 75X4 (MISCELLANEOUS)
LASER FIBER DISP (UROLOGICAL SUPPLIES) ×1 IMPLANT
PACK CYSTOSCOPY (CUSTOM PROCEDURE TRAY) ×2 IMPLANT
SET HIGH PRES BAL DIL (LABEL)
SHEATH ACCESS URETERAL 38CM (SHEATH) ×1 IMPLANT
SHEATH URET ACCESS 12FR/35CM (UROLOGICAL SUPPLIES) IMPLANT
SHEATH URET ACCESS 12FR/55CM (UROLOGICAL SUPPLIES) IMPLANT
STENT URET 6FRX26 CONTOUR (STENTS) ×1 IMPLANT
SYRINGE 10CC LL (SYRINGE) ×2 IMPLANT
SYRINGE IRR TOOMEY STRL 70CC (SYRINGE) IMPLANT
TUBE FEEDING 8FR 16IN STR KANG (MISCELLANEOUS) IMPLANT

## 2012-09-04 NOTE — Brief Op Note (Signed)
09/04/2012  1:44 PM  PATIENT:  Gary Hurst  62 y.o. male  PRE-OPERATIVE DIAGNOSIS:  RIGHT URETERAL AND RENAL STONES  POST-OPERATIVE DIAGNOSIS:  RIGHT URETERAL AND RENAL STONES  PROCEDURE:  Procedure(s): CYSTOSCOPY WITH  RIGHT  RETROGRADE PYELOGRAM, DIGITAL URETEROSCOPY AND STENT PLACEMENT (Right) HOLMIUM LASER APPLICATION (Right)  SURGEON:  Surgeon(s) and Role:    * Sebastian Ache, MD - Primary  PHYSICIAN ASSISTANT:   ASSISTANTS: none   ANESTHESIA:   general  EBL:  Total I/O In: 200 [I.V.:200] Out: -   BLOOD ADMINISTERED:none  DRAINS: none   LOCAL MEDICATIONS USED:  NONE  SPECIMEN:  Source of Specimen:  Rt ureteral and renal stones  DISPOSITION OF SPECIMEN:  [facility] for Compositional Analysis  COUNTS:  YES  TOURNIQUET:  * No tourniquets in log *  DICTATION: .Other Dictation: Dictation Number [id number]  PLAN OF CARE: Discharge to home after PACU  PATIENT DISPOSITION:  PACU - hemodynamically stable.   Delay start of Pharmacological VTE agent (>24hrs) due to surgical blood loss or risk of bleeding: not applicable

## 2012-09-04 NOTE — Transfer of Care (Signed)
Immediate Anesthesia Transfer of Care Note  Patient: Gary Hurst  Procedure(s) Performed: Procedure(s): CYSTOSCOPY WITH  RIGHT  RETROGRADE PYELOGRAM, DIGITAL URETEROSCOPY AND STENT PLACEMENT (Right) HOLMIUM LASER APPLICATION (Right)  Patient Location: PACU  Anesthesia Type:General  Level of Consciousness: sedated and responds to stimulation  Airway & Oxygen Therapy: Patient Spontanous Breathing and Patient connected to face mask oxygen  Post-op Assessment: Report given to PACU RN and Post -op Vital signs reviewed and stable  Post vital signs: Reviewed and stable  Complications: No apparent anesthesia complications

## 2012-09-04 NOTE — H&P (Signed)
Gary Hurst is an 62 y.o. male.    Chief Complaint: Pre-Op Rt Ureteroscopic Stone Manipulation  HPI:   1 - Rt Ureteral and Renal Stones -   Pt with Rt mid ureteral stone (7mm, 1200HU, SSD 13cm) as well as Rt>Lt intrarenal stones by CT in ER 7/4 on work-up of colicky rt flank pain. Has had numerous prior stones managed with medical therapy x 5 and lithotripsy x 2 previously. He is on K-Cit BID and drinks at least 1 gallon of water daily and has followed previously with Gary Hurst in [facility]. Prior partial matabolic eval wtih oliguria. Prior compositions predominant Ca-Ox.  No fever / chills / nausea / vomitting.  UA without infectious parameters. His pain has been problematic only moderately controlled with PO meds.  Pt's PCP is Gary Gosselin MD at [facility].   Today Gary Hurst is seen to proceed with Rt ureteroscopic stone manipulation for goal of unilateral "clean out". His contralateral stones are punctate.  Past Medical History  Diagnosis Date  . Hypertension   . Hyperlipidemia   . Right ureteral stone   . History of kidney stones   . Renal calculi     Past Surgical History  Procedure Laterality Date  . Laparoscopic cholecystectomy  09-05-2006  . Tonsillectomy  AGE 18  . Extracorporeal shock wave lithotripsy      X2    Family History  Problem Relation Age of Onset  . Parkinson's disease Mother   . Hypertension Mother   . Hyperlipidemia Mother   . Kidney Stones Father   . Alzheimer's disease Father   . Kidney Stones Sister   . Kidney Stones Brother    Social History:  reports that he quit smoking about 19 years ago. His smoking use included Cigarettes. He smoked 0.00 packs per day for 20 years. He has never used smokeless tobacco. He reports that  drinks alcohol. He reports that he does not use illicit drugs.  Allergies:  Allergies  Allergen Reactions  . Penicillins Rash    No prescriptions prior to admission    No results found for this or any previous  visit (from the past 48 hour(s)). No results found.  Review of Systems  Constitutional: Negative.  Negative for fever and chills.  HENT: Negative.   Eyes: Negative.   Respiratory: Negative.   Cardiovascular: Negative.   Gastrointestinal: Positive for nausea. Negative for vomiting and abdominal pain.  Genitourinary: Positive for flank pain.  Musculoskeletal: Negative.   Skin: Negative.   Neurological: Negative.   Endo/Heme/Allergies: Negative.   Psychiatric/Behavioral: Negative.     Height 5\' 9"  (1.753 m), weight 90.719 kg (200 lb). Physical Exam  Constitutional: He is oriented to person, place, and time. He appears well-developed and well-nourished.  HENT:  Head: Normocephalic and atraumatic.  Eyes: EOM are normal. Pupils are equal, round, and reactive to light.  Neck: Normal range of motion. Neck supple.  Cardiovascular: Normal rate and regular rhythm.   Respiratory: Effort normal and breath sounds normal.  GI: Soft. Bowel sounds are normal.  Genitourinary:  Mild Rt CVAT  Musculoskeletal: Normal range of motion.  Neurological: He is alert and oriented to person, place, and time.  Skin: Skin is warm and dry.  Psychiatric: He has a normal mood and affect. His behavior is normal. Judgment and thought content normal.     Assessment/Plan  1 - Rt Ureteral and Renal Stones -  We rediscussed ureteroscopic stone manipulation with basketing and laser-lithotripsy in detail.  We  rediscussed risks including bleeding, infection, damage to kidney / ureter  bladder, rarely loss of kidney. We rediscussed anesthetic risks and rare but serious surgical complications including DVT, PE, MI, and mortality. We specifically readdressed that in 5-10% of cases a staged approach is required with stenting followed by re-attempt ureteroscopy if anatomy unfavorable.   The patient voiced understanding and wises to proceed.     Gary Hurst 09/04/2012, 7:12 AM

## 2012-09-04 NOTE — Anesthesia Postprocedure Evaluation (Signed)
  Anesthesia Post-op Note  Patient: Gary Hurst  Procedure(s) Performed: Procedure(s) (LRB): CYSTOSCOPY WITH  RIGHT  RETROGRADE PYELOGRAM, DIGITAL URETEROSCOPY AND STENT PLACEMENT (Right) HOLMIUM LASER APPLICATION (Right)  Patient Location: PACU  Anesthesia Type: General  Level of Consciousness: awake and alert   Airway and Oxygen Therapy: Patient Spontanous Breathing  Post-op Pain: mild  Post-op Assessment: Post-op Vital signs reviewed, Patient's Cardiovascular Status Stable, Respiratory Function Stable, Patent Airway and No signs of Nausea or vomiting  Last Vitals:  Filed Vitals:   09/04/12 1430  BP: 121/75  Pulse: 52  Temp:   Resp: 12    Post-op Vital Signs: stable   Complications: No apparent anesthesia complications

## 2012-09-04 NOTE — Anesthesia Preprocedure Evaluation (Signed)
Anesthesia Evaluation  Patient identified by MRN, date of birth, ID band Patient awake    Reviewed: Allergy & Precautions, H&P , NPO status , Patient's Chart, lab work & pertinent test results  Airway Mallampati: II TM Distance: >3 FB Neck ROM: Full    Dental no notable dental hx.    Pulmonary neg pulmonary ROS,  breath sounds clear to auscultation  Pulmonary exam normal       Cardiovascular hypertension, Pt. on medications negative cardio ROS  Rhythm:Regular Rate:Normal     Neuro/Psych negative neurological ROS  negative psych ROS   GI/Hepatic negative GI ROS, Neg liver ROS,   Endo/Other  negative endocrine ROS  Renal/GU negative Renal ROS  negative genitourinary   Musculoskeletal negative musculoskeletal ROS (+)   Abdominal   Peds negative pediatric ROS (+)  Hematology negative hematology ROS (+)   Anesthesia Other Findings Upper front 4 teeth bridge  Reproductive/Obstetrics negative OB ROS                           Anesthesia Physical Anesthesia Plan  ASA: II  Anesthesia Plan: General   Post-op Pain Management:    Induction: Intravenous  Airway Management Planned: LMA  Additional Equipment:   Intra-op Plan:   Post-operative Plan:   Informed Consent: I have reviewed the patients History and Physical, chart, labs and discussed the procedure including the risks, benefits and alternatives for the proposed anesthesia with the patient or authorized representative who has indicated his/her understanding and acceptance.   Dental advisory given  Plan Discussed with: CRNA  Anesthesia Plan Comments:         Anesthesia Quick Evaluation

## 2012-09-04 NOTE — Anesthesia Procedure Notes (Signed)
Procedure Name: LMA Insertion Date/Time: 09/04/2012 12:29 PM Performed by: Maris Berger T Pre-anesthesia Checklist: Patient identified, Emergency Drugs available, Suction available and Patient being monitored Patient Re-evaluated:Patient Re-evaluated prior to inductionOxygen Delivery Method: Circle System Utilized Preoxygenation: Pre-oxygenation with 100% oxygen Intubation Type: IV induction Ventilation: Mask ventilation without difficulty LMA: LMA inserted LMA Size: 4.0 Number of attempts: 1 Airway Equipment and Method: bite block Placement Confirmation: positive ETCO2 Dental Injury: Teeth and Oropharynx as per pre-operative assessment

## 2012-09-05 ENCOUNTER — Encounter (HOSPITAL_BASED_OUTPATIENT_CLINIC_OR_DEPARTMENT_OTHER): Payer: Self-pay | Admitting: Urology

## 2012-09-05 NOTE — Op Note (Signed)
NAME:  Gary Hurst, Gary Hurst NO.:  [id number]  MEDICAL RECORD NO.:  [id number]  LOCATION:  WA05                         FACILITY:  [facility]  PHYSICIAN:  Sebastian Ache, MD     DATE OF BIRTH:  30-Jun-1950  DATE OF PROCEDURE: 09/04/2012 DATE OF DISCHARGE:                                OPERATIVE REPORT   DIAGNOSIS:  Right ureteral and renal stones with refractory renal colic.  PROCEDURE: 1. Cystoscopy with right retrograde pyelogram and interpretation. 2. Right ureteroscopy with laser lithotripsy. 3. Insertion of right ureteral stent 6 x 26 with tether to dorsum of     penis.  FINDINGS: 1. Unremarkable urinary bladder. 2. Multifocal right-sided stone with large stone in the proximal     ureter, and several stones in the right kidney mostly in the lower     pole.   ESTIMATED BLOOD LOSS:  Nil.  INDICATIONS:  Gary Hurst is a pleasant 62 year old gentleman with long history of recurrent nephrolithiasis.  He presented to the emergency room earlier this month with impressive right renal colic.  He was scheduled for elective ureteroscopic stone manipulation.  He presented to the office several days later with refractory symptoms and requested that the procedure be moved forward and we were happy to accommodate him.  He now presents for right ureteroscopic stone manipulation with goal of achieving unilateral stone free status. Notably, on his recent CT, he only had punctate stones on the contralateral side.  He is on potassium citrate and high volume p.o. intake to help reduce stone risk at baseline.  PROCEDURE IN DETAIL:  The patient being Gary Hurst, was verified. Procedure being right ureteroscopic stone manipulation was confirmed. Procedure was carried out.  Time-out was performed.  Intravenous antibiotics administered.  General LMA anesthesia was introduced.  The patient placed into a low lithotomy position.  Sterile field was created by prepping the patient's  penis, perineum, and proximal thighs using iodine x3.  Next, cystourethroscopy was performed using a 22-French rigid scope with 12-degree offset lens.  Inspection of bladder revealed no diverticula, calcifications, papular lesions.  The anterior and posterior urethra were unremarkable.  The right ureteral orifice was cannulated using a 6-French end-hole catheter and right retrograde pyelogram was obtained.  Right retrograde pyelogram demonstrated single right ureter, single system, right kidney.  There was a filling defect in the proximal ureter consistent with the stone.  A 0.038 Glidewire was advanced above this to the level of the upper pole and set aside as a safety wire.  Next, semi- rigid ureteroscopy was performed of the distal two-thirds of the right ureter alongside a separate Sensor working wire using normal saline irrigation under pressure.  An 8-French feeding tube in the urinary bladder for pressure release.  This revealed no mucosal abnormalities. The distal ureter and the stone was visualized using a semi-rigid ureteroscope.  However, this was at the extent of the length and it was felt that using a flexible technique would be the safest way to address the stone.  As such, the semi-rigid ureteroscope was exchanged for a 12/14 38 cm access sheath was placed under fluoroscopic vision to the level of the mid ureter.  Next, flexible ureteroscopy  was performed using 8-French digital ureteroscope of the proximal ureter.  The stone in question was encountered.  It appeared to be much too large for simple basketing.  As such, holmium laser energy was applied to the stone using settings of 0.6 joules and 6 hertz fragmenting the stone approximately 4 fragments which was then grasped with an escape basket and brought out in their entirety.  Inspection of the proximal ureter revealed no additional calcifications.  Next, panendoscopy was performed of the right kidney inspecting each  calyx systematically.  There were 3 separate areas of stone.  There was a upper pole papillary tip calcifications as well as some papillary tip calcifications at the mid pole, and a separate rather large lower pole stone.  The upper and mid aspects were controlled using a basketing technique and bring stones out in their entirety.  Lower pole stone appeared to be much too large for basketing, as such holmium laser energy was applied to the stone, fragmenting it and also in about 3-4 pieces and removing them.  Final endoscopic examination revealed excellent hemostasis.  No evidence of perforation.  No evidence of residual stone fragments larger than 1/3 mm.  The ureteral access sheath was removed under continuous ureteroscopic vision and no mucosal abnormalities were found.  Finally, a new 6 x 26 double-J stent was placed with remaining safety wire using cystoscopic and fluoroscopic guidance.  Good proximal and distal curl were noted.  A tether was left in place and fashioned to the dorsum of the penis.  Procedure was then terminated.  The patient tolerated the procedure well.  There were no immediate periprocedural complications. The patient was taken to postanesthesia care unit in stable condition.          ______________________________ Sebastian Ache, MD     TM/MEDQ  D:  09/04/2012  T:  09/05/2012  Job:  [id number]

## 2013-04-20 ENCOUNTER — Other Ambulatory Visit: Payer: Self-pay | Admitting: Dermatology

## 2013-04-20 DIAGNOSIS — C4491 Basal cell carcinoma of skin, unspecified: Secondary | ICD-10-CM

## 2013-04-20 HISTORY — DX: Basal cell carcinoma of skin, unspecified: C44.91

## 2013-05-21 ENCOUNTER — Other Ambulatory Visit: Payer: Self-pay | Admitting: Dermatology

## 2013-05-21 DIAGNOSIS — D229 Melanocytic nevi, unspecified: Secondary | ICD-10-CM

## 2013-05-21 HISTORY — DX: Melanocytic nevi, unspecified: D22.9

## 2013-06-04 ENCOUNTER — Other Ambulatory Visit: Payer: Self-pay | Admitting: Dermatology

## 2014-01-08 ENCOUNTER — Other Ambulatory Visit: Payer: Self-pay | Admitting: *Deleted

## 2014-01-08 NOTE — Telephone Encounter (Signed)
Encounter opened in error

## 2014-04-21 ENCOUNTER — Other Ambulatory Visit: Payer: Self-pay | Admitting: Dermatology

## 2014-07-27 IMAGING — CT CT ABD-PELV W/O CM
1 series · 15 of 28 positions shown, 19 images · non-contrast
Comparison: 04/26/2011

CLINICAL DATA: Right flank pain

CT ABDOMEN AND PELVIS WITHOUT CONTRAST
TECHNIQUE: Multidetector CT imaging of the abdomen and pelvis was
performed following the standard protocol without intravenous
contrast.

[Series 4: lung · axial · 0.87mm/px · z∈[-218,-98]mm · 15 of 28 slices shown, 19 images]
[im 3/28  soft-tissue]
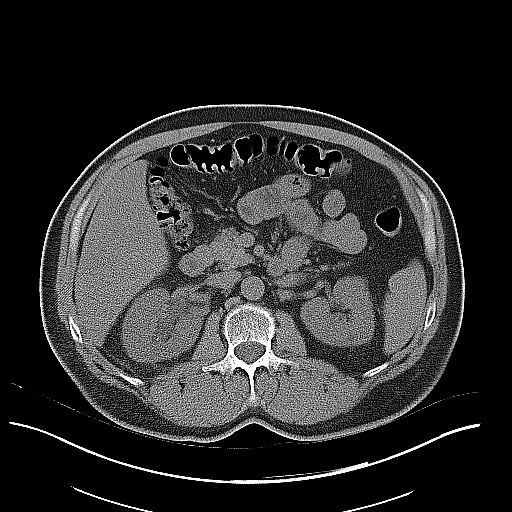
[im 3/28  bone]
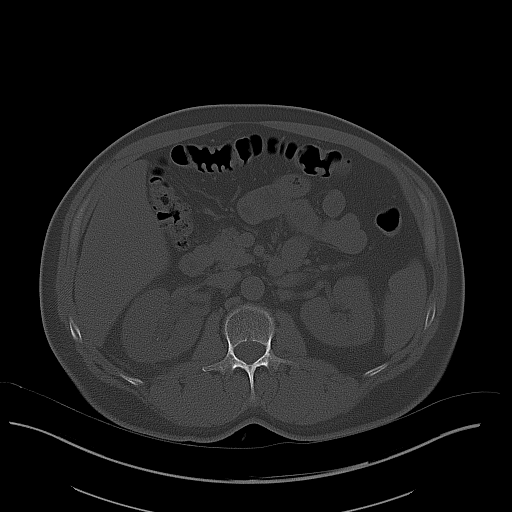
[im 5/28  soft-tissue]
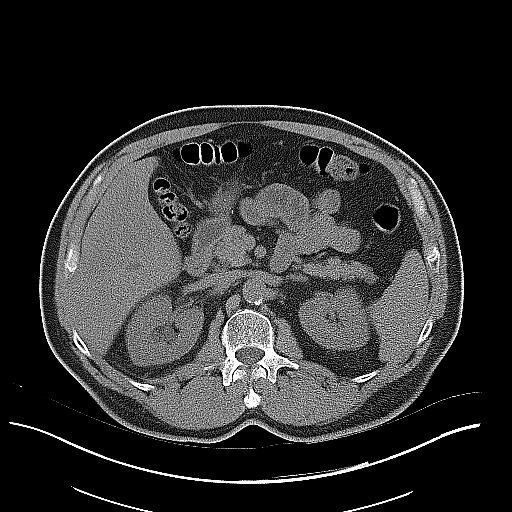
[im 7/28  soft-tissue]
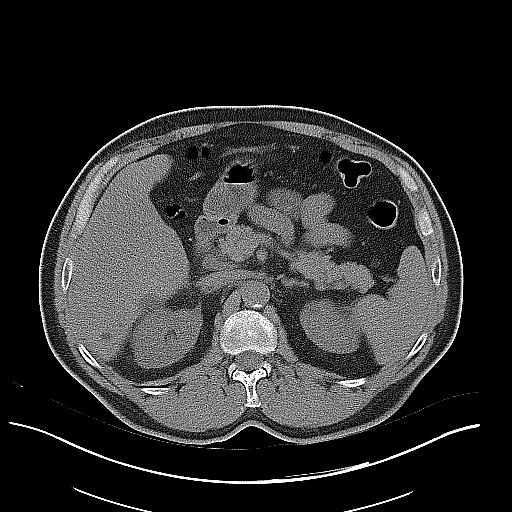
[im 9/28  soft-tissue]
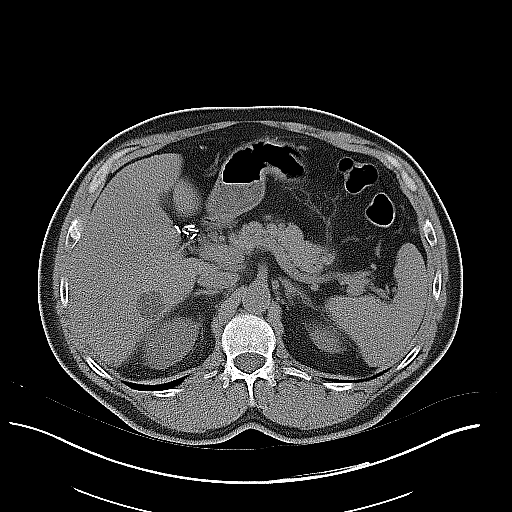
[im 11/28  soft-tissue]
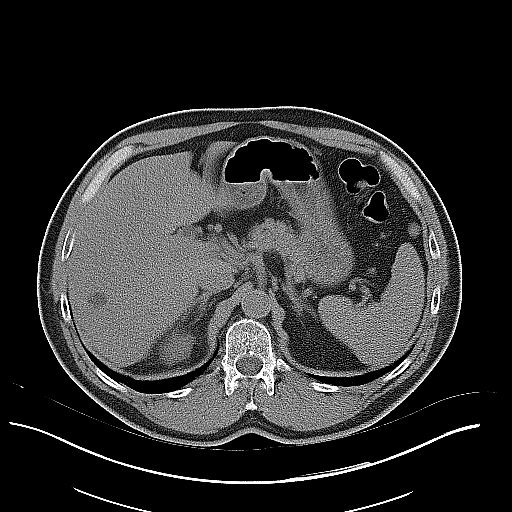
[im 13/28  soft-tissue]
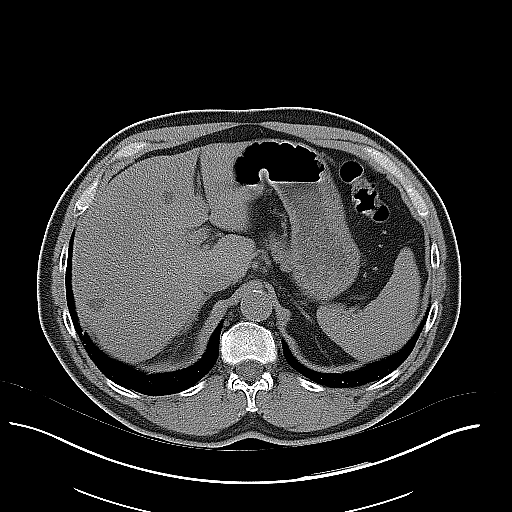
[im 15/28  soft-tissue]
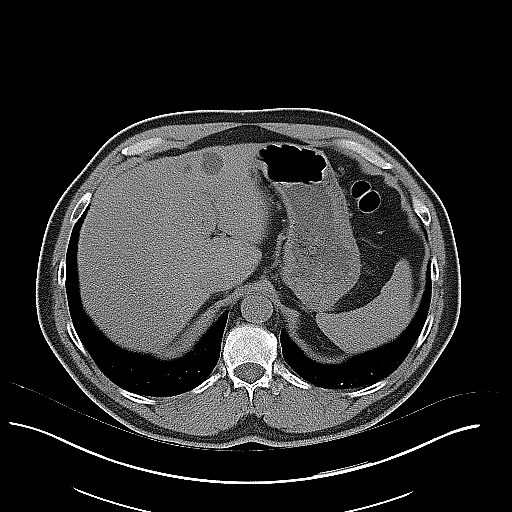
[im 17/28  soft-tissue]
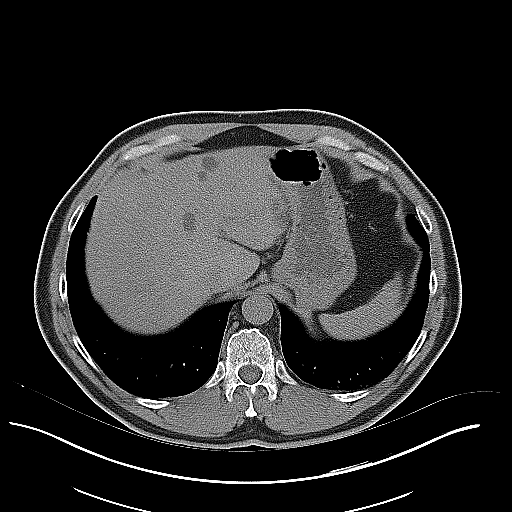
[im 19/28  soft-tissue]
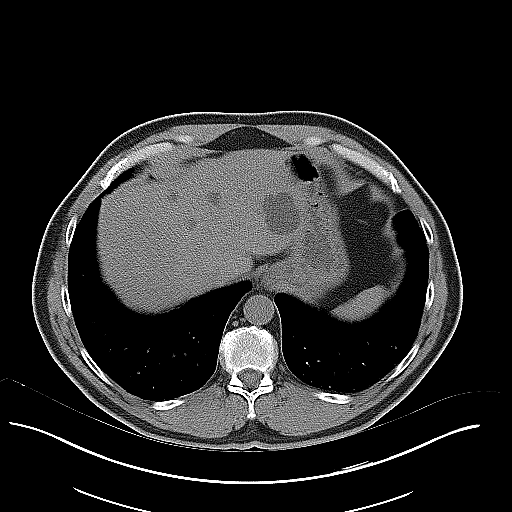
[im 19/28  bone]
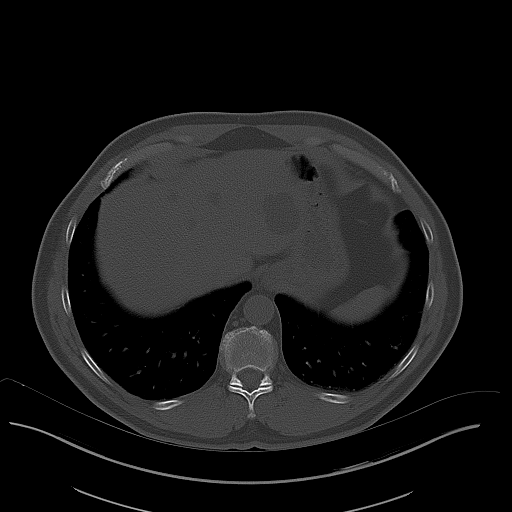
[im 21/28  soft-tissue]
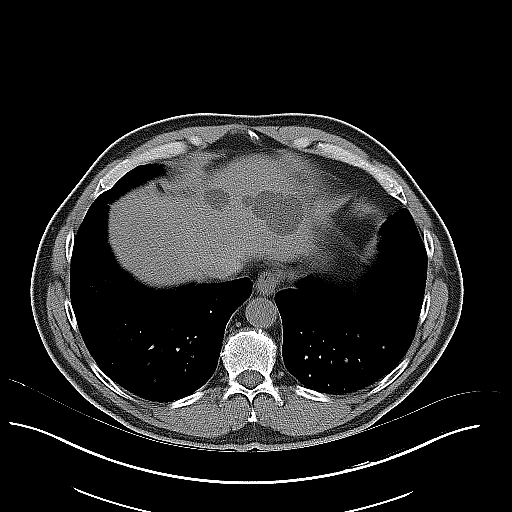
[im 23/28  soft-tissue]
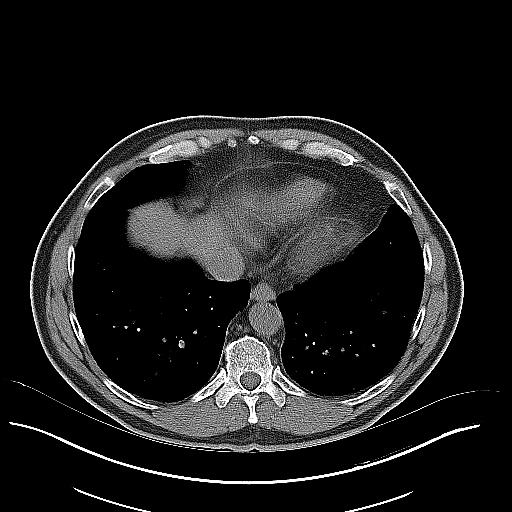
[im 24/28  lung]
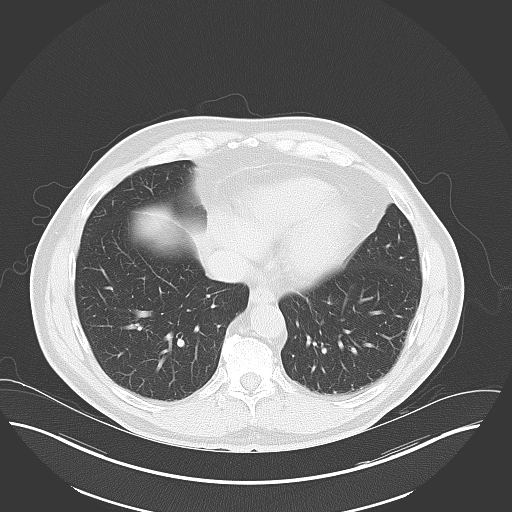
[im 25/28  soft-tissue]
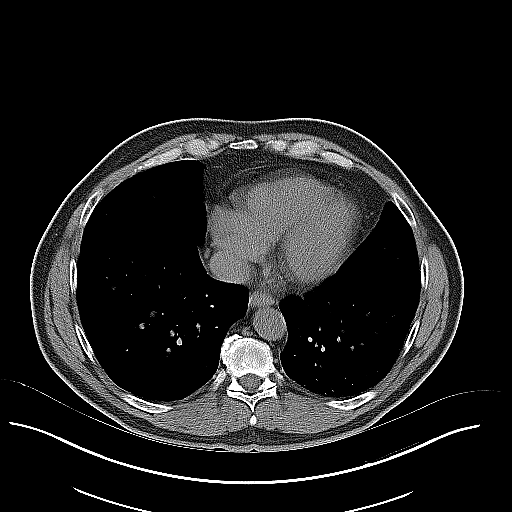
[im 25/28  lung]
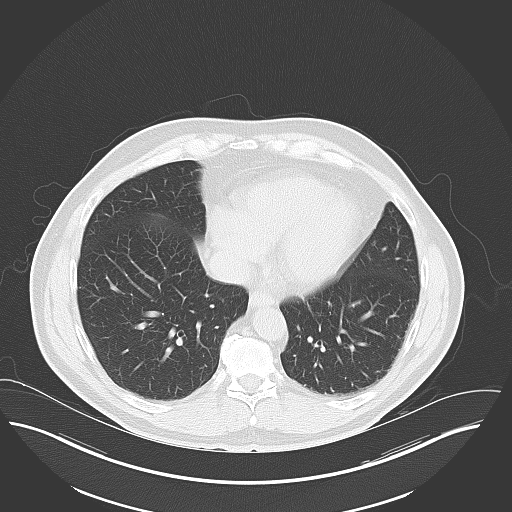
[im 26/28  lung]
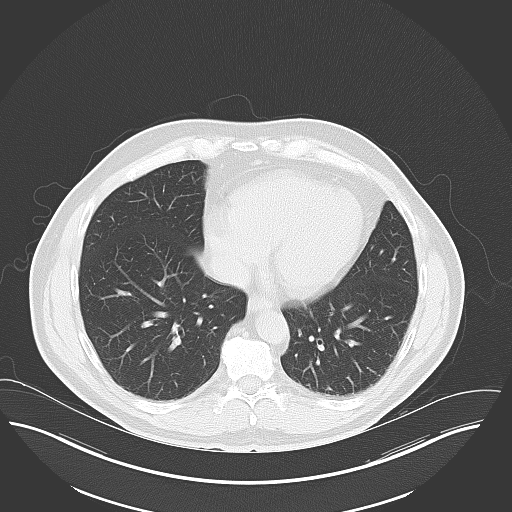
[im 27/28  soft-tissue]
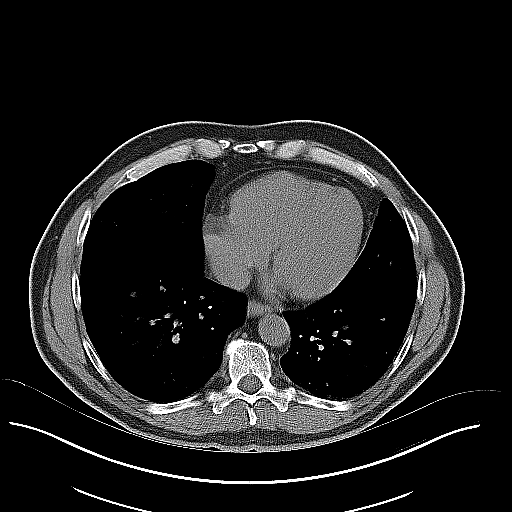
[im 27/28  lung]
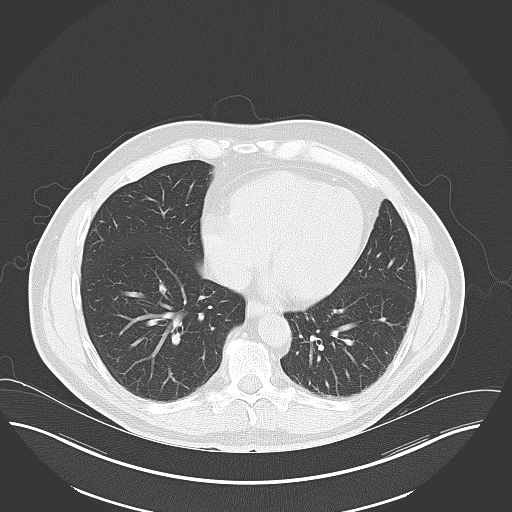

[15 of 28 positions shown; findings below may reference images not displayed]

FINDINGS: Lung bases are clear.

No pleural or pericardial effusion.  Mild diffuse fatty
infiltration of the liver noted.  There are multiple liver cysts
identified.  Previous cholecystectomy.  No biliary dilatation.  The
pancreas is unremarkable.  The spleen is normal.

The adrenal glands are both normal.  Bilateral renal calculi are
identified.
There is right-sided hydronephrosis and hydroureter.  Within the
mid right ureter there is a stone measuring 7 mm, image 44/series
2.  No left-sided hydronephrosis or hydroureter.  There is a cyst
within the mid pole of the left kidney measuring 2.8 cm.  This is
incompletely characterized without IV contrast.  Tiny calcification
is noted within the right posterior bladder base, image 71/series
2.  There are calcifications noted within the prostate gland.

Calcified atherosclerotic disease affects the abdominal aorta and
its branches.  No aneurysm.  There is no upper abdominal adenopathy
noted.  There is no pelvic or inguinal adenopathy identified.

The stomach and small bowel loops have a normal course and caliber.
No evidence for bowel obstruction.  The appendix is visualized and
appears normal.

Scattered colonic diverticula noted without acute inflammation.

Review of the visualized osseous structures is significant for mild
multilevel degenerative disc disease.
IMPRESSION: 1.  Right-sided hydronephrosis and proximal hydroureter.  There is
a stone within the mid right ureter measuring 7 mm.
2.  Bilateral nephrolithiasis.
3.  Liver and kidneys cysts.
4.  Prior cholecystectomy.
5.  Hepatic steatosis.
6.  Atherosclerosis.

## 2015-02-27 DIAGNOSIS — K5792 Diverticulitis of intestine, part unspecified, without perforation or abscess without bleeding: Secondary | ICD-10-CM

## 2015-02-27 HISTORY — DX: Diverticulitis of intestine, part unspecified, without perforation or abscess without bleeding: K57.92

## 2015-04-26 ENCOUNTER — Other Ambulatory Visit: Payer: Self-pay | Admitting: Family Medicine

## 2015-04-26 DIAGNOSIS — R1012 Left upper quadrant pain: Secondary | ICD-10-CM

## 2015-04-28 ENCOUNTER — Ambulatory Visit
Admission: RE | Admit: 2015-04-28 | Discharge: 2015-04-28 | Disposition: A | Discharge status: Home / Self Care | Payer: 59 | Source: Ambulatory Visit | Attending: Family Medicine | Admitting: Family Medicine

## 2015-04-28 DIAGNOSIS — R1012 Left upper quadrant pain: Secondary | ICD-10-CM

## 2015-04-28 MED ORDER — IOPAMIDOL (ISOVUE-300) INJECTION 61%
100.0000 mL | Freq: Once | INTRAVENOUS | Status: AC | PRN
  Administered 2015-04-28: 100 mL via INTRAVENOUS

## 2015-05-03 ENCOUNTER — Other Ambulatory Visit: Payer: Self-pay

## 2015-07-28 ENCOUNTER — Other Ambulatory Visit: Payer: Self-pay | Admitting: Gastroenterology

## 2015-11-28 ENCOUNTER — Other Ambulatory Visit: Payer: Self-pay | Admitting: Dermatology

## 2015-12-22 ENCOUNTER — Other Ambulatory Visit: Payer: Self-pay | Admitting: Dermatology

## 2016-03-19 ENCOUNTER — Other Ambulatory Visit: Payer: Self-pay | Admitting: Dermatology

## 2016-07-06 ENCOUNTER — Encounter (HOSPITAL_COMMUNITY): Payer: Self-pay | Admitting: Emergency Medicine

## 2016-07-06 ENCOUNTER — Observation Stay (HOSPITAL_COMMUNITY)
Admission: EM | Admit: 2016-07-06 | Discharge: 2016-07-08 | DRG: 392 | Disposition: A | Discharge status: Home / Self Care | Payer: 59 | Attending: Internal Medicine | Admitting: Internal Medicine

## 2016-07-06 DIAGNOSIS — K5720 Diverticulitis of large intestine with perforation and abscess without bleeding: Secondary | ICD-10-CM | POA: Diagnosis not present

## 2016-07-06 DIAGNOSIS — E7800 Pure hypercholesterolemia, unspecified: Secondary | ICD-10-CM | POA: Diagnosis present

## 2016-07-06 DIAGNOSIS — Z7982 Long term (current) use of aspirin: Secondary | ICD-10-CM | POA: Diagnosis not present

## 2016-07-06 DIAGNOSIS — N202 Calculus of kidney with calculus of ureter: Secondary | ICD-10-CM | POA: Diagnosis not present

## 2016-07-06 DIAGNOSIS — R109 Unspecified abdominal pain: Secondary | ICD-10-CM | POA: Diagnosis present

## 2016-07-06 DIAGNOSIS — Z880 Allergy status to penicillin: Secondary | ICD-10-CM

## 2016-07-06 DIAGNOSIS — K5732 Diverticulitis of large intestine without perforation or abscess without bleeding: Secondary | ICD-10-CM | POA: Diagnosis present

## 2016-07-06 DIAGNOSIS — Z960 Presence of urogenital implants: Secondary | ICD-10-CM | POA: Diagnosis present

## 2016-07-06 DIAGNOSIS — N200 Calculus of kidney: Secondary | ICD-10-CM | POA: Diagnosis not present

## 2016-07-06 DIAGNOSIS — Z87442 Personal history of urinary calculi: Secondary | ICD-10-CM

## 2016-07-06 DIAGNOSIS — T368X5A Adverse effect of other systemic antibiotics, initial encounter: Secondary | ICD-10-CM | POA: Diagnosis not present

## 2016-07-06 DIAGNOSIS — Z87891 Personal history of nicotine dependence: Secondary | ICD-10-CM

## 2016-07-06 DIAGNOSIS — L299 Pruritus, unspecified: Secondary | ICD-10-CM | POA: Diagnosis not present

## 2016-07-06 DIAGNOSIS — E785 Hyperlipidemia, unspecified: Secondary | ICD-10-CM

## 2016-07-06 DIAGNOSIS — I10 Essential (primary) hypertension: Secondary | ICD-10-CM | POA: Diagnosis not present

## 2016-07-06 HISTORY — DX: Diverticulitis of intestine, part unspecified, without perforation or abscess without bleeding: K57.92

## 2016-07-06 LAB — URINALYSIS, ROUTINE W REFLEX MICROSCOPIC
Bacteria, UA: NONE SEEN
Bilirubin Urine: NEGATIVE
GLUCOSE, UA: NEGATIVE mg/dL
Ketones, ur: NEGATIVE mg/dL
Leukocytes, UA: NEGATIVE
Nitrite: NEGATIVE
PH: 6 (ref 5.0–8.0)
Protein, ur: 30 mg/dL — AB
Specific Gravity, Urine: 1.02 (ref 1.005–1.030)

## 2016-07-06 LAB — CBC WITH DIFFERENTIAL/PLATELET
BASOS ABS: 0 10*3/uL (ref 0.0–0.1)
Basophils Relative: 1 %
Eosinophils Absolute: 0.2 10*3/uL (ref 0.0–0.7)
Eosinophils Relative: 2 %
HEMATOCRIT: 38.9 % — AB (ref 39.0–52.0)
HEMOGLOBIN: 13.4 g/dL (ref 13.0–17.0)
LYMPHS PCT: 32 %
Lymphs Abs: 2.9 10*3/uL (ref 0.7–4.0)
MCH: 30.5 pg (ref 26.0–34.0)
MCHC: 34.4 g/dL (ref 30.0–36.0)
MCV: 88.6 fL (ref 78.0–100.0)
MONO ABS: 0.5 10*3/uL (ref 0.1–1.0)
MONOS PCT: 5 %
NEUTROS ABS: 5.4 10*3/uL (ref 1.7–7.7)
NEUTROS PCT: 60 %
Platelets: 212 10*3/uL (ref 150–400)
RBC: 4.39 MIL/uL (ref 4.22–5.81)
RDW: 12.3 % (ref 11.5–15.5)
WBC: 8.9 10*3/uL (ref 4.0–10.5)

## 2016-07-06 LAB — COMPREHENSIVE METABOLIC PANEL
ALT: 28 U/L (ref 17–63)
AST: 16 U/L (ref 15–41)
Albumin: 3.8 g/dL (ref 3.5–5.0)
Alkaline Phosphatase: 48 U/L (ref 38–126)
Anion gap: 6 (ref 5–15)
BILIRUBIN TOTAL: 0.8 mg/dL (ref 0.3–1.2)
BUN: 15 mg/dL (ref 6–20)
CO2: 26 mmol/L (ref 22–32)
CREATININE: 0.93 mg/dL (ref 0.61–1.24)
Calcium: 9 mg/dL (ref 8.9–10.3)
Chloride: 108 mmol/L (ref 101–111)
GFR calc Af Amer: >60 mL/min (ref >60)
GLUCOSE: 79 mg/dL (ref 65–99)
POTASSIUM: 4 mmol/L (ref 3.5–5.1)
Sodium: 140 mmol/L (ref 135–145)
Total Protein: 6.6 g/dL (ref 6.5–8.1)

## 2016-07-06 MED ORDER — METRONIDAZOLE IN NACL 5-0.79 MG/ML-% IV SOLN
500.0000 mg | Freq: Once | INTRAVENOUS | Status: DC
  Filled 2016-07-06: qty 100

## 2016-07-06 MED ORDER — KETOROLAC TROMETHAMINE 30 MG/ML IJ SOLN: 30.0000 mg | Freq: Four times a day (QID) | INTRAMUSCULAR | Status: DC | PRN

## 2016-07-06 MED ORDER — ONDANSETRON HCL 4 MG PO TABS: 4.0000 mg | ORAL_TABLET | Freq: Four times a day (QID) | ORAL | Status: DC | PRN

## 2016-07-06 MED ORDER — METRONIDAZOLE IN NACL 5-0.79 MG/ML-% IV SOLN
500.0000 mg | Freq: Three times a day (TID) | INTRAVENOUS | Status: DC
  Administered 2016-07-06 – 2016-07-08 (×6): 500 mg via INTRAVENOUS
  Filled 2016-07-06 (×5): qty 100

## 2016-07-06 MED ORDER — SODIUM CHLORIDE 0.9 % IV BOLUS (SEPSIS)
500.0000 mL | Freq: Once | INTRAVENOUS | Status: AC
  Administered 2016-07-06: 500 mL via INTRAVENOUS

## 2016-07-06 MED ORDER — ACETAMINOPHEN 650 MG RE SUPP: 650.0000 mg | Freq: Four times a day (QID) | RECTAL | Status: DC | PRN

## 2016-07-06 MED ORDER — MORPHINE SULFATE (PF) 4 MG/ML IV SOLN
1.0000 mg | INTRAVENOUS | Status: DC | PRN
  Administered 2016-07-06: 1 mg via INTRAVENOUS
  Filled 2016-07-06: qty 1

## 2016-07-06 MED ORDER — CIPROFLOXACIN IN D5W 400 MG/200ML IV SOLN
400.0000 mg | Freq: Once | INTRAVENOUS | Status: AC
  Administered 2016-07-06: 400 mg via INTRAVENOUS
  Filled 2016-07-06: qty 200

## 2016-07-06 MED ORDER — HEPARIN SODIUM (PORCINE) 5000 UNIT/ML IJ SOLN
5000.0000 [IU] | Freq: Three times a day (TID) | INTRAMUSCULAR | Status: DC
  Administered 2016-07-06 – 2016-07-08 (×5): 5000 [IU] via SUBCUTANEOUS
  Filled 2016-07-06 (×5): qty 1

## 2016-07-06 MED ORDER — ACETAMINOPHEN 325 MG PO TABS
650.0000 mg | ORAL_TABLET | Freq: Four times a day (QID) | ORAL | Status: DC | PRN
Start: 2016-07-06 — End: 2016-07-08

## 2016-07-06 MED ORDER — CIPROFLOXACIN IN D5W 400 MG/200ML IV SOLN: 400.0000 mg | Freq: Two times a day (BID) | INTRAVENOUS | Status: DC

## 2016-07-06 MED ORDER — ONDANSETRON HCL 4 MG/2ML IJ SOLN: 4.0000 mg | Freq: Four times a day (QID) | INTRAMUSCULAR | Status: DC | PRN

## 2016-07-06 MED ORDER — ENALAPRILAT 1.25 MG/ML IV SOLN
0.6250 mg | Freq: Four times a day (QID) | INTRAVENOUS | Status: DC
  Administered 2016-07-06 – 2016-07-08 (×7): 0.625 mg via INTRAVENOUS
  Filled 2016-07-06 (×10): qty 0.5

## 2016-07-06 MED ORDER — DEXTROSE 5 % IV SOLN
2.0000 g | Freq: Once | INTRAVENOUS | Status: AC
  Administered 2016-07-06: 2 g via INTRAVENOUS
  Filled 2016-07-06: qty 2

## 2016-07-06 MED ORDER — SODIUM CHLORIDE 0.9 % IV SOLN
INTRAVENOUS | Status: DC
  Administered 2016-07-06 – 2016-07-08 (×3): via INTRAVENOUS

## 2016-07-06 NOTE — Progress Notes (Signed)
Pharmacy Antibiotic Note  Gary Hurst is a 66 y.o. male admitted on 07/06/2016 with intra-abdominal infection.  Pharmacy has been consulted for Cipro dosing.  Flagyl ordered by MD.  Plan: Cipro 400 mg IV q12h.  Renal function WNL so do not anticipate further dose adjustments. Pharmacy will sign off at this time. Please re-consult if needed.   Height: 5\' 8"  (172.7 cm) Weight: 192 lb 9 oz (87.3 kg) IBW/kg (Calculated) : 68.4  Temp (24hrs), Avg:97.9 F (36.6 C), Min:97.9 F (36.6 C), Max:97.9 F (36.6 C)   Recent Labs Lab 07/06/16 1613  WBC 8.9  CREATININE 0.93    Estimated Creatinine Clearance: 87.4 mL/min (by C-G formula based on SCr of 0.93 mg/dL).    Allergies  Allergen Reactions  . Penicillins Rash    Has patient had a PCN reaction causing immediate rash, facial/tongue/throat swelling, SOB or lightheadedness with hypotension: no Has patient had a PCN reaction causing severe rash involving mucus membranes or skin necrosis: yes Has patient had a PCN reaction occurring within the last 10 years: no If all of the above answers are "NO", then may proceed with Cephalosporin use.       Thank you for allowing pharmacy to be a part of this patient's care.  Gary Hurst 07/06/2016 5:25 PM

## 2016-07-06 NOTE — ED Provider Notes (Signed)
[facility] DEPT Provider Note   CSN: [id number] Arrival date & time: 07/06/16  1422     History   Chief Complaint Chief Complaint  Patient presents with  . Abdominal Pain    HPI Gary Hurst is a 66 y.o. male.  HPI  This a 66 year old man who has a history of hypertension, hypercholesterolemia, and kidney stones who presents today with report that he has an abnormality on his CAT scan. He had a CT done at [facility] urology today for pain in the left flank. It did show a stone and left renal pelvis that was 8 mm. However it also showed an area of acute sigmoid diverticulitis with small contained perforation. He states he has been having some left-sided abdominal pain on and off for 5 days. When he it occurs it is 10 out of 10. He has taken some pain medicine today and is not having severe pain at this time. He denies any nausea, vomiting, diarrhea, fever or chills. He has been eating and drinking without difficulty. He has no prior history of similar symptoms.  Past Medical History:  Diagnosis Date  . History of kidney stones   . Hyperlipidemia   . Hypertension   . Renal calculi   . Right ureteral stone     There are no active problems to display for this patient.   Past Surgical History:  Procedure Laterality Date  . CYSTOSCOPY WITH RETROGRADE PYELOGRAM, URETEROSCOPY AND STENT PLACEMENT Right 09/04/2012   Procedure: CYSTOSCOPY WITH  RIGHT  RETROGRADE PYELOGRAM, DIGITAL URETEROSCOPY AND STENT PLACEMENT;  Surgeon: Alexis Frock, MD;  Location: [facility];  Service: Urology;  Laterality: Right;  . EXTRACORPOREAL SHOCK WAVE LITHOTRIPSY     X2  . HOLMIUM LASER APPLICATION Right 9/56/3875   Procedure: HOLMIUM LASER APPLICATION;  Surgeon: Alexis Frock, MD;  Location: [facility];  Service: Urology;  Laterality: Right;  . LAPAROSCOPIC CHOLECYSTECTOMY  09-05-2006  . TONSILLECTOMY  AGE 51       Home Medications    Prior to Admission  medications   Medication Sig Start Date End Date Taking? Authorizing Provider  aspirin EC 81 MG tablet Take 81 mg by mouth daily.    [provider]  atorvastatin (LIPITOR) 40 MG tablet Take 40 mg by mouth every evening.     [provider]  ondansetron (ZOFRAN) 4 MG tablet Take 1 tablet (4 mg total) by mouth every 6 (six) hours. 08/29/12   Domenic Moras, PA-C  oxyCODONE-acetaminophen (PERCOCET/ROXICET) 5-325 MG per tablet Take 1-2 tablets by mouth every 4 (four) hours as needed for pain. (postoperatively) 09/04/12   Alexis Frock, MD  potassium citrate (UROCIT-K) 10 MEQ (1080 MG) SR tablet Take 10 mEq by mouth 2 (two) times daily.    [provider]  ramipril (ALTACE) 10 MG capsule Take 10 mg by mouth every morning.     [provider]  senna-docusate (SENOKOT-S) 8.6-50 MG per tablet Take 1 tablet by mouth 2 (two) times daily. While taking pain meds to prevent constipation 09/04/12   Alexis Frock, MD  sulfamethoxazole-trimethoprim (BACTRIM DS) 800-160 MG per tablet Take 1 tablet by mouth daily. X 6 days to prevent infection 09/04/12   Alexis Frock, MD  tamsulosin (FLOMAX) 0.4 MG CAPS Take 0.4 mg by mouth daily. 08/29/12   Domenic Moras, PA-C    Family History Family History  Problem Relation Age of Onset  . Parkinson's disease Mother   . Hypertension Mother   . Hyperlipidemia Mother   .  Kidney Stones Father   . Alzheimer's disease Father   . Kidney Stones Sister   . Kidney Stones Brother     Social History Social History  Substance Use Topics  . Smoking status: Former Smoker    Years: 20.00    Types: Cigarettes    Quit date: 09/02/1993  . Smokeless tobacco: Never Used     Comment: [place]  . Alcohol use Yes     Comment: RARE     Allergies   Penicillins   Review of Systems Review of Systems  Constitutional: Negative.  Negative for fever.  HENT: Negative.   Eyes: Negative.   Respiratory: Negative.     Cardiovascular: Negative.   Gastrointestinal: Positive for abdominal pain. Negative for blood in stool, constipation, diarrhea, nausea, rectal pain and vomiting.  Endocrine: Negative.   Genitourinary: Positive for flank pain. Negative for dysuria and frequency.  Skin: Negative.   Neurological: Negative.   Hematological: Negative.   Psychiatric/Behavioral: Negative.   All other systems reviewed and are negative.    Physical Exam Updated Vital Signs BP 140/90 (BP Location: Left Arm)   Pulse (!) 54   Temp 97.9 F (36.6 C) (Oral)   Resp 16   Ht 5\' 8"  (1.727 m)   Wt 87.3 kg   SpO2 100%   BMI 29.28 kg/m   Physical Exam  Constitutional: He is oriented to person, place, and time. He appears well-developed and well-nourished.  HENT:  Head: Normocephalic and atraumatic.  Right Ear: External ear normal.  Eyes: EOM are normal. Pupils are equal, round, and reactive to light.  Neck: Normal range of motion. Neck supple.  Cardiovascular: Normal rate and regular rhythm.   Pulmonary/Chest: Effort normal and breath sounds normal.  Abdominal: Soft. Bowel sounds are normal. There is tenderness.    Musculoskeletal: Normal range of motion.  Neurological: He is alert and oriented to person, place, and time. No cranial nerve deficit. Coordination normal.  Skin: Skin is warm and dry. Capillary refill takes less than 2 seconds.  Psychiatric: He has a normal mood and affect.  Nursing note and vitals reviewed.    ED Treatments / Results  Labs (all labs ordered are listed, but only abnormal results are displayed) Labs Reviewed  URINALYSIS, ROUTINE W REFLEX MICROSCOPIC  CBC WITH DIFFERENTIAL/PLATELET  COMPREHENSIVE METABOLIC PANEL    EKG  EKG Interpretation None       Radiology No results found. IMPRESSION: 1. Examination is positive for acute sigmoid diverticulitis. Small contained perforation is identified. No abscess at this time. 2. Bilateral nephrolithiasis. There is a stone  within the left renal pelvis which measures 8 mm. 3. Kidney and liver cysts. 4. Aortic Atherosclerosis (ICD10-I70.0). 5. These results will be called to the ordering clinician or representative by the Radiologist Assistant, and communication documented in the PACS or zVision Dashboard.   Electronically Signed By: Kerby Moors M.D. On: 07/06/2016 13:09    Procedures Procedures (including critical care time)  Medications Ordered in ED Medications  sodium chloride 0.9 % bolus 500 mL (not administered)  ceFEPIme (MAXIPIME) 2 g in dextrose 5 % 50 mL IVPB (not administered)    And  metroNIDAZOLE (FLAGYL) IVPB 500 mg (not administered)     Initial Impression / Assessment and Plan / ED Course  I have reviewed the triage vital signs and the nursing notes.  Pertinent labs & imaging results that were available during my care of the patient were reviewed by me and considered in  my medical decision making (see chart for details). Patient treated here for Diverticulitis With plan and Flagyl. He is currently not having significant pain. He is made nothing by mouth pending surgery consultation.   discussed with Jackson Latino via nurse in [place]. Attending Dr. Carolynn Comment and are aware of patient.  Discussed with Dr. Wyline Copas and he will admit   Final Clinical Impressions(s) / ED Diagnoses   Final diagnoses:  Diverticulitis of large intestine with perforation, unspecified bleeding status    New Prescriptions New Prescriptions   No medications on file     Pattricia Boss, MD 07/07/16 [phone]

## 2016-07-06 NOTE — Consult Note (Signed)
[facility] Consult/Admission Note  Gary Hurst [birth year]  [id number].    Requesting MD: Dr. Jeanell Sparrow Chief Complaint/Reason for Consult: diverticulitis   HPI:  Pt is a 66 year old male with a history of diverticulitis, kidney stones seen at urology today for stones with a CT scan at [facility] positive for sigmoid diverticulitis sent to the ED from urology. Pt states he has been having pain in his LLQ for weeks. He states the pain has progressively worsened, is constant, severe at times, non radiating and located in the LLQ. He has been having night sweats for the last 4 nights and intermittent nausea. He was aware he had kidney stones but was unaware of the diverticulitis and believed his abdominal pain was related to the kidney stones. His last bout of diverticulitis was 09/2015 and he was treated out patient with antibiotics. He has hematuria. Pt denies dysuria, diarrhea, blood in his stools, vomiting, fevers.   ROS:  Review of Systems  Constitutional: Positive for diaphoresis. Negative for chills and fever.  Respiratory: Negative for cough and shortness of breath.   Cardiovascular: Negative for chest pain.  Gastrointestinal: Positive for abdominal pain and nausea. Negative for blood in stool, constipation, diarrhea and vomiting.  Genitourinary: Positive for hematuria. Negative for dysuria.  Musculoskeletal: Negative for back pain and falls.  Skin: Negative for rash.  Neurological: Negative for dizziness, loss of consciousness and headaches.  All other systems reviewed and are negative.    Family History  Problem Relation Age of Onset  . Parkinson's disease Mother   . Hypertension Mother   . Hyperlipidemia Mother   . Kidney Stones Father   . Alzheimer's disease Father   . Kidney Stones Sister   . Kidney Stones Brother     Past Medical History:  Diagnosis Date  . History of kidney stones   . Hyperlipidemia   . Hypertension   . Renal calculi   . Right  ureteral stone     Past Surgical History:  Procedure Laterality Date  . CYSTOSCOPY WITH RETROGRADE PYELOGRAM, URETEROSCOPY AND STENT PLACEMENT Right 09/04/2012   Procedure: CYSTOSCOPY WITH  RIGHT  RETROGRADE PYELOGRAM, DIGITAL URETEROSCOPY AND STENT PLACEMENT;  Surgeon: Alexis Frock, MD;  Location: [facility];  Service: Urology;  Laterality: Right;  . EXTRACORPOREAL SHOCK WAVE LITHOTRIPSY     X2  . HOLMIUM LASER APPLICATION Right 9/38/1017   Procedure: HOLMIUM LASER APPLICATION;  Surgeon: Alexis Frock, MD;  Location: [facility];  Service: Urology;  Laterality: Right;  . LAPAROSCOPIC CHOLECYSTECTOMY  09-05-2006  . TONSILLECTOMY  AGE 49    Social History:  reports that he quit smoking about 22 years ago. His smoking use included Cigarettes. He quit after 20.00 years of use. He has never used smokeless tobacco. He reports that he drinks alcohol. He reports that he does not use drugs.  Allergies:  Allergies  Allergen Reactions  . Penicillins Rash     (Not in a hospital admission)  Blood pressure 140/90, pulse (!) 54, temperature 97.9 F (36.6 C), temperature source Oral, resp. rate 16, height 5\' 8"  (1.727 m), weight 192 lb 9 oz (87.3 kg), SpO2 100 %.  Physical Exam  Constitutional: He is oriented to person, place, and time and well-developed, well-nourished, and in no distress. No distress.  HENT:  Head: Normocephalic and atraumatic.  Nose: Nose normal.  Mouth/Throat: Oropharynx is clear and moist. No oropharyngeal exudate.  Eyes: Conjunctivae are normal. Pupils are equal, round, and reactive to light.  Right eye exhibits no discharge. Left eye exhibits no discharge. No scleral icterus.  Neck: Normal range of motion. Neck supple. No tracheal deviation present. No thyromegaly present.  Cardiovascular: Normal rate, regular rhythm, normal heart sounds and intact distal pulses.  Exam reveals no gallop and no friction rub.   No murmur heard. Pulses:       Radial pulses are 2+ on the right side, and 2+ on the left side.       Dorsalis pedis pulses are 2+ on the right side, and 2+ on the left side.  Pulmonary/Chest: Effort normal and breath sounds normal. No respiratory distress. He has no wheezes. He has no rales.  Abdominal: Soft. Normal appearance and bowel sounds are normal. He exhibits no distension. There is no hepatosplenomegaly. There is tenderness in the left lower quadrant. There is no rebound and no guarding.  Musculoskeletal: Normal range of motion. He exhibits no edema, tenderness or deformity.  Lymphadenopathy:    He has no cervical adenopathy.  Neurological: He is alert and oriented to person, place, and time. No cranial nerve deficit (grossly normal). GCS score is 15.  Skin: Skin is warm and dry. No rash noted. He is not diaphoretic.  Psychiatric: Mood and affect normal.  Nursing note and vitals reviewed.   Results for orders placed or performed during the hospital encounter of 07/06/16 (from the past 48 hour(s))  Urinalysis, Routine w reflex microscopic     Status: Abnormal   Collection Time: 07/06/16  3:47 PM  Result Value Ref Range   Color, Urine YELLOW YELLOW   APPearance CLEAR CLEAR   Specific Gravity, Urine 1.020 1.005 - 1.030   pH 6.0 5.0 - 8.0   Glucose, UA NEGATIVE NEGATIVE mg/dL   Hgb urine dipstick LARGE (A) NEGATIVE   Bilirubin Urine NEGATIVE NEGATIVE   Ketones, ur NEGATIVE NEGATIVE mg/dL   Protein, ur 30 (A) NEGATIVE mg/dL   Nitrite NEGATIVE NEGATIVE   Leukocytes, UA NEGATIVE NEGATIVE   RBC / HPF TOO NUMEROUS TO COUNT 0 - 5 RBC/hpf   WBC, UA 6-30 0 - 5 WBC/hpf   Bacteria, UA NONE SEEN NONE SEEN   Squamous Epithelial / LPF 0-5 (A) NONE SEEN   Mucous PRESENT    No results found.   Assessment/Plan  Kidney stones - 55mm stone in left renal pelvis HTN  Diverticulitis - acute sigmoid diverticulitis with small contained perforation - WBC normal  - recommend bowel rest with sips and chips and IV  antibiotics.  - no surgical intervention is indicated at this time.   Pt states he is going to have the kidney stone removed during this hospital stay. We will better be able to assess his pain from the diverticulitis when the distracting pain from the kidney stone is resolved.   We will continue to follow this pt. Thank you for the consult.    Kalman Drape, [facility] 07/06/2016, 4:27 PM Pager: [phone] Consults: [phone] Mon-Fri 7:00 am-4:30 pm Sat-Sun 7:00 am-11:30 am

## 2016-07-06 NOTE — H&P (Signed)
History and Physical    JATAVIOUS Hurst JJK:[id number] DOB: 12-13-1950 DOA: 07/06/2016  PCP: System, Provider Not In  Patient coming from: Home  Chief Complaint: Flank pain  HPI: Gary Hurst is a 66 y.o. male with medical history significant of HLD, HTN, renal stones who had been followed by Urology where pt underwent CT abd/pelvis on 5/11. CT scan noted an 36mm stone in the L renal pelvis. Scan also noted evidence of acute sigmoid diverticulitis with small contained perforation without abscess. Patient was referred to ED.   On further questioning, patient had hx of diverticulitis in 8/17, treated with abx with follow up by Dr. Watt Climes as outpt. Reports nausea, denies constipation or diarrhea.  ED Course: In the ED, CBC and CMP were ordered, still pending at this time. Patient was started on cipro and flagyl. General Surgery consulted. Hospitalist consulted for consideration for admission.   Review of Systems:  Review of Systems  Constitutional: Negative for chills, fever and malaise/fatigue.  HENT: Negative for congestion, ear pain, sinus pain and tinnitus.   Eyes: Negative for photophobia, pain and discharge.  Respiratory: Negative for hemoptysis, sputum production and shortness of breath.   Cardiovascular: Negative for chest pain, palpitations, orthopnea and claudication.  Gastrointestinal: Positive for abdominal pain and nausea. Negative for constipation and diarrhea.  Genitourinary: Positive for flank pain. Negative for frequency and hematuria.  Musculoskeletal: Negative for back pain, falls and neck pain.  Neurological: Negative for tingling, tremors, seizures and loss of consciousness.  Psychiatric/Behavioral: Negative for hallucinations and memory loss. The patient is not nervous/anxious.     Past Medical History:  Diagnosis Date  . History of kidney stones   . Hyperlipidemia   . Hypertension   . Renal calculi   . Right ureteral stone     Past Surgical History:    Procedure Laterality Date  . CYSTOSCOPY WITH RETROGRADE PYELOGRAM, URETEROSCOPY AND STENT PLACEMENT Right 09/04/2012   Procedure: CYSTOSCOPY WITH  RIGHT  RETROGRADE PYELOGRAM, DIGITAL URETEROSCOPY AND STENT PLACEMENT;  Surgeon: Alexis Frock, MD;  Location: [facility];  Service: Urology;  Laterality: Right;  . EXTRACORPOREAL SHOCK WAVE LITHOTRIPSY     X2  . HOLMIUM LASER APPLICATION Right 3/71/6967   Procedure: HOLMIUM LASER APPLICATION;  Surgeon: Alexis Frock, MD;  Location: [facility];  Service: Urology;  Laterality: Right;  . LAPAROSCOPIC CHOLECYSTECTOMY  09-05-2006  . TONSILLECTOMY  AGE 60     reports that he quit smoking about 22 years ago. His smoking use included Cigarettes. He quit after 20.00 years of use. He has never used smokeless tobacco. He reports that he drinks alcohol. He reports that he does not use drugs.  Allergies  Allergen Reactions  . Penicillins Rash    Has patient had a PCN reaction causing immediate rash, facial/tongue/throat swelling, SOB or lightheadedness with hypotension: no Has patient had a PCN reaction causing severe rash involving mucus membranes or skin necrosis: yes Has patient had a PCN reaction occurring within the last 10 years: no If all of the above answers are "NO", then may proceed with Cephalosporin use.     Family History  Problem Relation Age of Onset  . Parkinson's disease Mother   . Hypertension Mother   . Hyperlipidemia Mother   . Kidney Stones Father   . Alzheimer's disease Father   . Kidney Stones Sister   . Kidney Stones Brother     Prior to Admission medications   Medication Sig Start Date End Date Taking?  Authorizing Provider  aspirin EC 81 MG tablet Take 81 mg by mouth daily.    [provider]  atorvastatin (LIPITOR) 40 MG tablet Take 40 mg by mouth every evening.     [provider]  ondansetron (ZOFRAN) 4 MG tablet Take 1 tablet (4 mg total) by mouth every 6 (six)  hours. 08/29/12   Domenic Moras, PA-C  oxyCODONE-acetaminophen (PERCOCET/ROXICET) 5-325 MG per tablet Take 1-2 tablets by mouth every 4 (four) hours as needed for pain. (postoperatively) 09/04/12   Alexis Frock, MD  potassium citrate (UROCIT-K) 10 MEQ (1080 MG) SR tablet Take 10 mEq by mouth 2 (two) times daily.    [provider]  ramipril (ALTACE) 10 MG capsule Take 10 mg by mouth every morning.     [provider]  senna-docusate (SENOKOT-S) 8.6-50 MG per tablet Take 1 tablet by mouth 2 (two) times daily. While taking pain meds to prevent constipation 09/04/12   Alexis Frock, MD  sulfamethoxazole-trimethoprim (BACTRIM DS) 800-160 MG per tablet Take 1 tablet by mouth daily. X 6 days to prevent infection 09/04/12   Alexis Frock, MD  tamsulosin (FLOMAX) 0.4 MG CAPS Take 0.4 mg by mouth daily. 08/29/12   Domenic Moras, PA-C    Physical Exam: Vitals:   07/06/16 1429 07/06/16 1546  BP: (!) 142/85 140/90  Pulse: 71 (!) 54  Resp: 18 16  Temp: 97.9 F (36.6 C)   TempSrc: Oral   SpO2: 98% 100%  Weight: 87.3 kg (192 lb 9 oz)   Height: 5\' 8"  (1.727 m)     Constitutional: NAD, calm, comfortable Vitals:   07/06/16 1429 07/06/16 1546  BP: (!) 142/85 140/90  Pulse: 71 (!) 54  Resp: 18 16  Temp: 97.9 F (36.6 C)   TempSrc: Oral   SpO2: 98% 100%  Weight: 87.3 kg (192 lb 9 oz)   Height: 5\' 8"  (1.727 m)    Eyes: PERRL, lids and conjunctivae normal  ENMT: Mucous membranes are moist. Posterior pharynx clear of any exudate or lesions.Normal dentition.  Neck: normal, supple, no masses, no thyromegaly Respiratory: clear to auscultation bilaterally, no wheezing, no crackles. Normal respiratory effort. No accessory muscle use.  Cardiovascular: Regular rate and rhythm Abdomen: pos BS, soft, lower quadrant tenderness, nondistended Musculoskeletal: no clubbing / cyanosis. No joint deformity upper and lower extremities. Good ROM, no contractures. Normal muscle tone.  Skin: no rashes,  lesions, ulcers. No induration Neurologic: CN 2-12 grossly intact. Sensation intact, DTR normal. Strength 5/5 in all 4.  Psychiatric: Normal judgment and insight. Alert and oriented x 3. Normal mood.    Labs on Admission: I have personally reviewed following labs and imaging studies  CBC:  Recent Labs Lab 07/06/16 1613  WBC 8.9  NEUTROABS 5.4  HGB 13.4  HCT 38.9*  MCV 88.6  PLT 628   Basic Metabolic Panel:  Recent Labs Lab 07/06/16 1613  NA 140  K 4.0  CL 108  CO2 26  GLUCOSE 79  BUN 15  CREATININE 0.93  CALCIUM 9.0   GFR: Estimated Creatinine Clearance: 87.4 mL/min (by C-G formula based on SCr of 0.93 mg/dL). Liver Function Tests:  Recent Labs Lab 07/06/16 1613  AST 16  ALT 28  ALKPHOS 48  BILITOT 0.8  PROT 6.6  ALBUMIN 3.8   No results for input(s): LIPASE, AMYLASE in the last 168 hours. No results for input(s): AMMONIA in the last 168 hours. Coagulation Profile: No results for input(s): INR, PROTIME in the last 168 hours. Cardiac Enzymes:  No results for input(s): CKTOTAL, CKMB, CKMBINDEX, TROPONINI in the last 168 hours. BNP (last 3 results) No results for input(s): PROBNP in the last 8760 hours. HbA1C: No results for input(s): HGBA1C in the last 72 hours. CBG: No results for input(s): GLUCAP in the last 168 hours. Lipid Profile: No results for input(s): CHOL, HDL, LDLCALC, TRIG, CHOLHDL, LDLDIRECT in the last 72 hours. Thyroid Function Tests: No results for input(s): TSH, T4TOTAL, FREET4, T3FREE, THYROIDAB in the last 72 hours. Anemia Panel: No results for input(s): VITAMINB12, FOLATE, FERRITIN, TIBC, IRON, RETICCTPCT in the last 72 hours. Urine analysis:    Component Value Date/Time   COLORURINE YELLOW 07/06/2016 [place] 07/06/2016 1547   LABSPEC 1.020 07/06/2016 1547   PHURINE 6.0 07/06/2016 1547   GLUCOSEU NEGATIVE 07/06/2016 1547   HGBUR LARGE (A) 07/06/2016 1547   BILIRUBINUR NEGATIVE 07/06/2016 1547   KETONESUR  NEGATIVE 07/06/2016 1547   PROTEINUR 30 (A) 07/06/2016 1547   UROBILINOGEN 1.0 08/29/2012 1118   NITRITE NEGATIVE 07/06/2016 1547   LEUKOCYTESUR NEGATIVE 07/06/2016 1547   Sepsis Labs: !!!!!!!!!!!!!!!!!!!!!!!!!!!!!!!!!!!!!!!!!!!! @LABRCNTIP (procalcitonin:4,lacticidven:4) )No results found for this or any previous visit (from the past 240 hour(s)).   Radiological Exams on Admission: No results found.   Outpt CT abd/pelvis from 5/11 personally reviewed. Evidence of sigmoid diverticulitis with contained perforation, no abscess. Also 59mm renal stone in L renal pelvis  EKG: Independently reviewed. Ordered, pending  Assessment/Plan Principal Problem:   Diverticulitis of sigmoid colon Active Problems:   HTN (hypertension)   HLD (hyperlipidemia)   Kidney stone on left side   1. Sigmoid diverticulitis 1. CT abd/pelvis from OSH personally reviewed. Evidence of diverticulitis per above 2. Will continue on IV cipro and flagyl 3. Currently afebrile with no leukocytosis 4. General Surgery consulted by EDP 5. Admit to med-surg 6. Repeat bmet and CBC in AM 7. Keep NPO for now with basal IVF 2. HTN 1. BP currently stable 2. Cont to monitor 3. HLD 1. Stable at this time 2. Would resume home meds when able to take PO 4. L sided kidney stone 1. Appears stable at this time 2. Followed by Urology  DVT prophylaxis: Heparin subq  Code Status: Full Family Communication: Pt in room  Disposition Plan: Uncertain at this time  Consults called: General Surgery Admission status: inpt as patient will likely require more than 2 week for diverticulitis to improve   Latiesha Harada, Orpah Melter MD [facility] Pager [phone]  If 7PM-7AM, please contact night-coverage [email or web address] Password [email or web address]  07/06/2016, 5:06 PM

## 2016-07-06 NOTE — Progress Notes (Signed)
Started IV cipro. Pt called out within minutes to state that he feels itchy all over his legs and arms. Pt does not have hives, swelling. Cipro turned off, hospitalist paged. Will continue to monitor at this time.

## 2016-07-06 NOTE — ED Triage Notes (Addendum)
Pt sent for "rip in colon and told I need surgery." Pt complaint of LLQ with associated nausea. Pt also has upcoming surgery to remove kidney stone.

## 2016-07-07 DIAGNOSIS — K5732 Diverticulitis of large intestine without perforation or abscess without bleeding: Secondary | ICD-10-CM | POA: Diagnosis not present

## 2016-07-07 DIAGNOSIS — K5720 Diverticulitis of large intestine with perforation and abscess without bleeding: Secondary | ICD-10-CM | POA: Diagnosis not present

## 2016-07-07 DIAGNOSIS — N200 Calculus of kidney: Secondary | ICD-10-CM | POA: Diagnosis not present

## 2016-07-07 LAB — COMPREHENSIVE METABOLIC PANEL
ALT: 23 U/L (ref 17–63)
AST: 15 U/L (ref 15–41)
Albumin: 3.4 g/dL — ABNORMAL LOW (ref 3.5–5.0)
Alkaline Phosphatase: 46 U/L (ref 38–126)
Anion gap: 8 (ref 5–15)
BUN: 14 mg/dL (ref 6–20)
CHLORIDE: 106 mmol/L (ref 101–111)
CO2: 26 mmol/L (ref 22–32)
CREATININE: 0.96 mg/dL (ref 0.61–1.24)
Calcium: 8.7 mg/dL — ABNORMAL LOW (ref 8.9–10.3)
Glucose, Bld: 90 mg/dL (ref 65–99)
POTASSIUM: 4 mmol/L (ref 3.5–5.1)
Sodium: 140 mmol/L (ref 135–145)
Total Bilirubin: 1.1 mg/dL (ref 0.3–1.2)
Total Protein: 6 g/dL — ABNORMAL LOW (ref 6.5–8.1)

## 2016-07-07 LAB — HIV ANTIBODY (ROUTINE TESTING W REFLEX): HIV Screen 4th Generation wRfx: NONREACTIVE

## 2016-07-07 LAB — CBC
HCT: 36.7 % — ABNORMAL LOW (ref 39.0–52.0)
Hemoglobin: 12.5 g/dL — ABNORMAL LOW (ref 13.0–17.0)
MCH: 30 pg (ref 26.0–34.0)
MCHC: 34.1 g/dL (ref 30.0–36.0)
MCV: 88 fL (ref 78.0–100.0)
PLATELETS: 215 10*3/uL (ref 150–400)
RBC: 4.17 MIL/uL — AB (ref 4.22–5.81)
RDW: 12.3 % (ref 11.5–15.5)
WBC: 7.3 10*3/uL (ref 4.0–10.5)

## 2016-07-07 MED ORDER — SACCHAROMYCES BOULARDII 250 MG PO CAPS
250.0000 mg | ORAL_CAPSULE | Freq: Two times a day (BID) | ORAL | Status: DC
  Administered 2016-07-07 – 2016-07-08 (×3): 250 mg via ORAL
  Filled 2016-07-07 (×3): qty 1

## 2016-07-07 MED ORDER — DIPHENHYDRAMINE HCL 50 MG PO CAPS
50.0000 mg | ORAL_CAPSULE | Freq: Four times a day (QID) | ORAL | Status: DC | PRN
  Administered 2016-07-07 – 2016-07-08 (×2): 50 mg via ORAL
  Filled 2016-07-07 (×2): qty 1

## 2016-07-07 NOTE — Progress Notes (Signed)
Patient ID: Gary Hurst, male   DOB: 1950/11/07, 66 y.o.   MRN: [id number] [facility] Surgery Progress Note:   * No surgery found *  Subjective: Mental status is clear Objective: Vital signs in last 24 hours: Temp:  [97.8 F (36.6 C)-98.9 F (37.2 C)] 97.8 F (36.6 C) (05/12 0519) Pulse Rate:  [54-71] 62 (05/12 0519) Resp:  [16-18] 18 (05/12 0519) BP: (133-142)/(77-90) 139/86 (05/12 0519) SpO2:  [96 %-100 %] 96 % (05/12 0519) Weight:  [87.3 kg (192 lb 9 oz)] 87.3 kg (192 lb 9 oz) (05/11 1429)  Intake/Output from previous day: 05/11 0701 - 05/12 0700 In: 950 [I.V.:800; IV Piggyback:150] Out: -  Intake/Output this shift: No intake/output data recorded.  Physical Exam: Work of breathing is normal;  Pain is minimal  Lab Results:  Results for orders placed or performed during the hospital encounter of 07/06/16 (from the past 48 hour(s))  Urinalysis, Routine w reflex microscopic     Status: Abnormal   Collection Time: 07/06/16  3:47 PM  Result Value Ref Range   Color, Urine YELLOW YELLOW   APPearance CLEAR CLEAR   Specific Gravity, Urine 1.020 1.005 - 1.030   pH 6.0 5.0 - 8.0   Glucose, UA NEGATIVE NEGATIVE mg/dL   Hgb urine dipstick LARGE (A) NEGATIVE   Bilirubin Urine NEGATIVE NEGATIVE   Ketones, ur NEGATIVE NEGATIVE mg/dL   Protein, ur 30 (A) NEGATIVE mg/dL   Nitrite NEGATIVE NEGATIVE   Leukocytes, UA NEGATIVE NEGATIVE   RBC / HPF TOO NUMEROUS TO COUNT 0 - 5 RBC/hpf   WBC, UA 6-30 0 - 5 WBC/hpf   Bacteria, UA NONE SEEN NONE SEEN   Squamous Epithelial / LPF 0-5 (A) NONE SEEN   Mucous PRESENT   CBC with Differential/Platelet     Status: Abnormal   Collection Time: 07/06/16  4:13 PM  Result Value Ref Range   WBC 8.9 4.0 - 10.5 K/uL   RBC 4.39 4.22 - 5.81 MIL/uL   Hemoglobin 13.4 13.0 - 17.0 g/dL   HCT 38.9 (L) 39.0 - 52.0 %   MCV 88.6 78.0 - 100.0 fL   MCH 30.5 26.0 - 34.0 pg   MCHC 34.4 30.0 - 36.0 g/dL   RDW 12.3 11.5 - 15.5 %   Platelets 212 150 - 400  K/uL   Neutrophils Relative % 60 %   Neutro Abs 5.4 1.7 - 7.7 K/uL   Lymphocytes Relative 32 %   Lymphs Abs 2.9 0.7 - 4.0 K/uL   Monocytes Relative 5 %   Monocytes Absolute 0.5 0.1 - 1.0 K/uL   Eosinophils Relative 2 %   Eosinophils Absolute 0.2 0.0 - 0.7 K/uL   Basophils Relative 1 %   Basophils Absolute 0.0 0.0 - 0.1 K/uL  Comprehensive metabolic panel     Status: None   Collection Time: 07/06/16  4:13 PM  Result Value Ref Range   Sodium 140 135 - 145 mmol/L   Potassium 4.0 3.5 - 5.1 mmol/L   Chloride 108 101 - 111 mmol/L   CO2 26 22 - 32 mmol/L   Glucose, Bld 79 65 - 99 mg/dL   BUN 15 6 - 20 mg/dL   Creatinine, Ser 0.93 0.61 - 1.24 mg/dL   Calcium 9.0 8.9 - 10.3 mg/dL   Total Protein 6.6 6.5 - 8.1 g/dL   Albumin 3.8 3.5 - 5.0 g/dL   AST 16 15 - 41 U/L   ALT 28 17 - 63 U/L   Alkaline Phosphatase  48 38 - 126 U/L   Total Bilirubin 0.8 0.3 - 1.2 mg/dL   GFR calc non Af Amer >60 >60 mL/min   GFR calc Af Amer >60 >60 mL/min    Comment: (NOTE) The eGFR has been calculated using the CKD EPI equation. This calculation has not been validated in all clinical situations. eGFR's persistently <60 mL/min signify possible Chronic Kidney Disease.    Anion gap 6 5 - 15  HIV antibody (Routine Testing)     Status: None   Collection Time: 07/06/16  6:49 PM  Result Value Ref Range   HIV Screen 4th Generation wRfx Non Reactive Non Reactive    Comment: (NOTE) Performed At: [facility] [place], [place] [id number] Lindon Romp MD FM:[id number]   Comprehensive metabolic panel     Status: Abnormal   Collection Time: 07/07/16  6:06 AM  Result Value Ref Range   Sodium 140 135 - 145 mmol/L   Potassium 4.0 3.5 - 5.1 mmol/L   Chloride 106 101 - 111 mmol/L   CO2 26 22 - 32 mmol/L   Glucose, Bld 90 65 - 99 mg/dL   BUN 14 6 - 20 mg/dL   Creatinine, Ser 0.96 0.61 - 1.24 mg/dL   Calcium 8.7 (L) 8.9 - 10.3 mg/dL   Total Protein 6.0 (L) 6.5 - 8.1 g/dL   Albumin 3.4  (L) 3.5 - 5.0 g/dL   AST 15 15 - 41 U/L   ALT 23 17 - 63 U/L   Alkaline Phosphatase 46 38 - 126 U/L   Total Bilirubin 1.1 0.3 - 1.2 mg/dL   GFR calc non Af Amer >60 >60 mL/min   GFR calc Af Amer >60 >60 mL/min    Comment: (NOTE) The eGFR has been calculated using the CKD EPI equation. This calculation has not been validated in all clinical situations. eGFR's persistently <60 mL/min signify possible Chronic Kidney Disease.    Anion gap 8 5 - 15  CBC     Status: Abnormal   Collection Time: 07/07/16  6:06 AM  Result Value Ref Range   WBC 7.3 4.0 - 10.5 K/uL   RBC 4.17 (L) 4.22 - 5.81 MIL/uL   Hemoglobin 12.5 (L) 13.0 - 17.0 g/dL   HCT 36.7 (L) 39.0 - 52.0 %   MCV 88.0 78.0 - 100.0 fL   MCH 30.0 26.0 - 34.0 pg   MCHC 34.1 30.0 - 36.0 g/dL   RDW 12.3 11.5 - 15.5 %   Platelets 215 150 - 400 K/uL    Radiology/Results: No results found.  Anti-infectives: Anti-infectives    Start     Dose/Rate Route Frequency Ordered Stop   07/07/16 1000  ciprofloxacin (CIPRO) IVPB 400 mg     400 mg 200 mL/hr over 60 Minutes Intravenous Every 12 hours 07/06/16 1735     07/07/16 0000  ciprofloxacin (CIPRO) IVPB 400 mg     400 mg 200 mL/hr over 60 Minutes Intravenous  Once 07/06/16 1739 07/06/16 2338   07/06/16 1800  metroNIDAZOLE (FLAGYL) IVPB 500 mg     500 mg 100 mL/hr over 60 Minutes Intravenous Every 8 hours 07/06/16 1707     07/06/16 1600  ceFEPIme (MAXIPIME) 2 g in dextrose 5 % 50 mL IVPB     2 g 100 mL/hr over 30 Minutes Intravenous  Once 07/06/16 1556 07/06/16 1722   07/06/16 1600  metroNIDAZOLE (FLAGYL) IVPB 500 mg  Status:  Discontinued     500 mg 100 mL/hr  over 60 Minutes Intravenous  Once 07/06/16 1556 07/06/16 1707      Assessment/Plan: Problem List: Patient Active Problem List   Diagnosis Date Noted  . HTN (hypertension) 07/06/2016  . HLD (hyperlipidemia) 07/06/2016  . Kidney stone on left side 07/06/2016  . Diverticulitis of sigmoid colon 07/06/2016  . Sigmoid  diverticulitis 07/06/2016    Passing flatus.  Hopefully can start clears soon * No surgery found *    LOS: 1 day   Matt B. Hassell Done, MD, [facility] [phone] beeper [phone]  07/07/2016 10:11 AM

## 2016-07-07 NOTE — Consult Note (Signed)
I have been asked to see the patient by Dr. Marylu Lund, for evaluation and management of Left sided nephrolithiasis.  History of present illness: 66 year old male who presented to the urology clinic yesterday midmorning with a week of left-sided flank and abdominal pain. The patient had some associated nausea. His pain was intermittent. He was not having any fevers or chills. He was having several nights of night sweats leading up to his presentation. In the clinic he had an abdomen/pelvis CT scan, stone protocol without IV contrast which demonstrated a large left-sided stone burden and a UPJ stone. There was minimal hydronephrosis. The patient's pain was reasonably well controlled on oral pain medications, and our plan was to have him follow-up for percutaneous nephrolithotomy and a fairly near future. However, there was an incidental finding of a perforated sigmoid diverticulum and as such the patient presented to the hospital for further evaluation.  This morning, the patient is complaining of very mild pain. He is not having any CVA tenderness. Most of his pain is in the left lower quadrant. He has been afebrile. He is voiding without issue. He denies any nausea or vomiting. He denies any constipation or diarrhea.  Review of systems: A 12 point comprehensive review of systems was obtained and is negative unless otherwise stated in the history of present illness.  Patient Active Problem List   Diagnosis Date Noted  . HTN (hypertension) 07/06/2016  . HLD (hyperlipidemia) 07/06/2016  . Kidney stone on left side 07/06/2016  . Diverticulitis of sigmoid colon 07/06/2016  . Sigmoid diverticulitis 07/06/2016    No current facility-administered medications on file prior to encounter.    Current Outpatient Prescriptions on File Prior to Encounter  Medication Sig Dispense Refill  . aspirin EC 81 MG tablet Take 81 mg by mouth daily.    Marland Kitchen atorvastatin (LIPITOR) 40 MG tablet Take 40 mg by mouth every  evening.     . ramipril (ALTACE) 10 MG capsule Take 10 mg by mouth every morning.       Past Medical History:  Diagnosis Date  . Diverticulitis   . History of kidney stones   . Hyperlipidemia   . Hypertension   . Renal calculi   . Right ureteral stone     Past Surgical History:  Procedure Laterality Date  . CYSTOSCOPY WITH RETROGRADE PYELOGRAM, URETEROSCOPY AND STENT PLACEMENT Right 09/04/2012   Procedure: CYSTOSCOPY WITH  RIGHT  RETROGRADE PYELOGRAM, DIGITAL URETEROSCOPY AND STENT PLACEMENT;  Surgeon: Alexis Frock, MD;  Location: [facility];  Service: Urology;  Laterality: Right;  . EXTRACORPOREAL SHOCK WAVE LITHOTRIPSY     X2  . HOLMIUM LASER APPLICATION Right 2/59/5638   Procedure: HOLMIUM LASER APPLICATION;  Surgeon: Alexis Frock, MD;  Location: [facility];  Service: Urology;  Laterality: Right;  . LAPAROSCOPIC CHOLECYSTECTOMY  09-05-2006  . TONSILLECTOMY  AGE 42    Social History  Substance Use Topics  . Smoking status: Former Smoker    Years: 20.00    Types: Cigarettes    Quit date: 09/02/1993  . Smokeless tobacco: Never Used     Comment: [place]  . Alcohol use Yes     Comment: RARE    Family History  Problem Relation Age of Onset  . Parkinson's disease Mother   . Hypertension Mother   . Hyperlipidemia Mother   . Kidney Stones Father   . Alzheimer's disease Father   . Kidney Stones Sister   . Kidney Stones  Brother     PE: Vitals:   07/06/16 1725 07/06/16 1840 07/06/16 2045 07/07/16 0519  BP: 133/78  (!) 141/77 139/86  Pulse: (!) 59 (!) 54 (!) 57 62  Resp: 18 18 17 18   Temp:  97.9 F (36.6 C) 98.9 F (37.2 C) 97.8 F (36.6 C)  TempSrc:  Oral Oral Oral  SpO2: 100% 98% 99% 96%  Weight:      Height:       Patient appears to be in no acute distress  patient is alert and oriented x3 Atraumatic normocephalic head No cervical or supraclavicular lymphadenopathy appreciated No increased work of  breathing, no audible wheezes/rhonchi Regular sinus rhythm/rate Abdomen is soft, Tender to palpation in the left lower quadrant. nondistended, no CVA or suprapubic tenderness Lower extremities are symmetric without appreciable edema Grossly neurologically intact No identifiable skin lesions   Recent Labs  07/06/16 1613 07/07/16 0606  WBC 8.9 7.3  HGB 13.4 12.5*  HCT 38.9* 36.7*    Recent Labs  07/06/16 1613 07/07/16 0606  NA 140 140  K 4.0 4.0  CL 108 106  CO2 26 26  GLUCOSE 79 90  BUN 15 14  CREATININE 0.93 0.96  CALCIUM 9.0 8.7*   No results for input(s): LABPT, INR in the last 72 hours. No results for input(s): LABURIN in the last 72 hours. No results found for this or any previous visit.  Imaging: CT scan, abdomen/pelvis, noncontrast- performed in [facility] on 07/06/16: This demonstrates a 1 cm UPJ stone, 4 mm stone directly behind that, and 2 5 mm stones that are nonobstructing. There is mild hydronephrosis. There are nonobstructing stones on the right side. There is a small microperforation in the left colon associated abscess.  Imp: The patient has a moderate stone burden in the left with a stone that is at the UPJ and may be contributing to his intermittent flank pain. Currently, he is not having any pain in that area. I suspect the pain that he is having is directly related to his diverticulitis. I suspect he will have ongoing episodes of intermittent flank pain as the UPJ stone is likely creating a ball valve effect and intermittently obstructing.  Recommendations: I spoke to the patient about his symptoms and clinical circumstance. I offered to place a stent in the left ureter to take the stones out of the equation completely. However, the patient feels like his pain is not that bad in his flank and he is willing to tolerate any discomfort to avoid any stent prior to him having a left-sided percutaneous nephrolithotomy. Fortunately, his creatinine is normal  and he has been afebrile without any evidence of infection.  The timing of his stone surgery is somewhat dependent on the clinical course of his diverticulitis, and I will touch base with general surgery and get their opinion on this.  The surgery will be done by Dr. Tresa Moore on an elective basis in the near future, not likely during this current hospitalization. The patient is well aware of this. We will touch base again tomorrow for to ensure the patient continues to be pain-free and for more clarity on the timing of his pending stone surgery.  Thank you for involving me in this patient's care, I will continue to follow along  Ardis Hughs

## 2016-07-07 NOTE — Progress Notes (Signed)
PROGRESS NOTE    MICHAL CALLICOTT  XIP:[id number] DOB: 1950/05/01 DOA: 07/06/2016 PCP: System, Provider Not In    Brief Narrative:  66 y.o. male with medical history significant of HLD, HTN, renal stones who had been followed by Urology where pt underwent CT abd/pelvis on 5/11. CT scan noted an 65mm stone in the L renal pelvis. Scan also noted evidence of acute sigmoid diverticulitis with small contained perforation without abscess. Patient was referred to ED.   On further questioning, patient had hx of diverticulitis in 8/17, treated with abx with follow up by Dr. Watt Climes as outpt. Reports nausea, denies constipation or diarrhea.  ED Course: In the ED, CBC and CMP were ordered, still pending at this time. Patient was started on cipro and flagyl. General Surgery consulted. Hospitalist consulted for consideration for admission.   Assessment & Plan:   Principal Problem:   Diverticulitis of sigmoid colon Active Problems:   HTN (hypertension)   HLD (hyperlipidemia)   Kidney stone on left side   Sigmoid diverticulitis   1. Sigmoid diverticulitis 1. CT abd/pelvis from OSH personally reviewed. Evidence of diverticulitis noted 2. Initially started on IV cipro and flagyl, however pt developed pruritis with cipro, thus has been discontinued 3. Will continue on flagyl monotherapy for now 4. Have ordered probiotic 5. remains afebrile with no leukocytosis 6. General Surgery following, recs reviewed. No plans for surgery at this time 7. Advance diet to clears per Surgery recs 2. HTN 1. BP remains stable 2. Cont to monitor 3. HLD 1. Patient remains stable 2. Would resume home meds when able to take PO 4. L sided kidney stone 1. Patient remained stable 2. Seen by Urology. Left percutaneous nephrolithotomy is planned, however timing is uncertain. Likely to be done on elective basis in near future  DVT prophylaxis: Heparin subQ Code Status: Full Family Communication: Pt in room, family  at bedside Disposition Plan: Uncertain at this time  Consultants:   GI  Urology  Procedures:     Antimicrobials: Anti-infectives    Start     Dose/Rate Route Frequency Ordered Stop   07/07/16 1000  ciprofloxacin (CIPRO) IVPB 400 mg  Status:  Discontinued     400 mg 200 mL/hr over 60 Minutes Intravenous Every 12 hours 07/06/16 1735 07/07/16 1125   07/07/16 0000  ciprofloxacin (CIPRO) IVPB 400 mg     400 mg 200 mL/hr over 60 Minutes Intravenous  Once 07/06/16 1739 07/06/16 2338   07/06/16 1800  metroNIDAZOLE (FLAGYL) IVPB 500 mg     500 mg 100 mL/hr over 60 Minutes Intravenous Every 8 hours 07/06/16 1707     07/06/16 1600  ceFEPIme (MAXIPIME) 2 g in dextrose 5 % 50 mL IVPB     2 g 100 mL/hr over 30 Minutes Intravenous  Once 07/06/16 1556 07/06/16 1722   07/06/16 1600  metroNIDAZOLE (FLAGYL) IVPB 500 mg  Status:  Discontinued     500 mg 100 mL/hr over 60 Minutes Intravenous  Once 07/06/16 1556 07/06/16 1707       Subjective: Feeling better today. States pain is mostly resolved this AM  Objective: Vitals:   07/06/16 1840 07/06/16 2045 07/07/16 0519 07/07/16 1312  BP:  (!) 141/77 139/86 (!) 145/76  Pulse: (!) 54 (!) 57 62 63  Resp: 18 17 18 18   Temp: 97.9 F (36.6 C) 98.9 F (37.2 C) 97.8 F (36.6 C) 98 F (36.7 C)  TempSrc: Oral Oral Oral Oral  SpO2: 98% 99% 96% 99%  Weight:  Height:        Intake/Output Summary (Last 24 hours) at 07/07/16 1642 Last data filed at 07/07/16 0600  Gross per 24 hour  Intake              950 ml  Output                0 ml  Net              950 ml   Filed Weights   07/06/16 1429  Weight: 87.3 kg (192 lb 9 oz)    Examination:  General exam: Appears calm and comfortable  Respiratory system: Clear to auscultation. Respiratory effort normal. Cardiovascular system: S1 & S2 heard, RRR Gastrointestinal system: Abdomen is nondistended, soft and nontender. No organomegaly or masses felt. Normal bowel sounds heard. Central  nervous system: Alert and oriented. No focal neurological deficits. Extremities: Symmetric 5 x 5 power. Skin: No rashes, lesions Psychiatry: Judgement and insight appear normal. Mood & affect appropriate.   Data Reviewed: I have personally reviewed following labs and imaging studies  CBC:  Recent Labs Lab 07/06/16 1613 07/07/16 0606  WBC 8.9 7.3  NEUTROABS 5.4  --   HGB 13.4 12.5*  HCT 38.9* 36.7*  MCV 88.6 88.0  PLT 212 157   Basic Metabolic Panel:  Recent Labs Lab 07/06/16 1613 07/07/16 0606  NA 140 140  K 4.0 4.0  CL 108 106  CO2 26 26  GLUCOSE 79 90  BUN 15 14  CREATININE 0.93 0.96  CALCIUM 9.0 8.7*   GFR: Estimated Creatinine Clearance: 84.7 mL/min (by C-G formula based on SCr of 0.96 mg/dL). Liver Function Tests:  Recent Labs Lab 07/06/16 1613 07/07/16 0606  AST 16 15  ALT 28 23  ALKPHOS 48 46  BILITOT 0.8 1.1  PROT 6.6 6.0*  ALBUMIN 3.8 3.4*   No results for input(s): LIPASE, AMYLASE in the last 168 hours. No results for input(s): AMMONIA in the last 168 hours. Coagulation Profile: No results for input(s): INR, PROTIME in the last 168 hours. Cardiac Enzymes: No results for input(s): CKTOTAL, CKMB, CKMBINDEX, TROPONINI in the last 168 hours. BNP (last 3 results) No results for input(s): PROBNP in the last 8760 hours. HbA1C: No results for input(s): HGBA1C in the last 72 hours. CBG: No results for input(s): GLUCAP in the last 168 hours. Lipid Profile: No results for input(s): CHOL, HDL, LDLCALC, TRIG, CHOLHDL, LDLDIRECT in the last 72 hours. Thyroid Function Tests: No results for input(s): TSH, T4TOTAL, FREET4, T3FREE, THYROIDAB in the last 72 hours. Anemia Panel: No results for input(s): VITAMINB12, FOLATE, FERRITIN, TIBC, IRON, RETICCTPCT in the last 72 hours. Sepsis Labs: No results for input(s): PROCALCITON, LATICACIDVEN in the last 168 hours.  No results found for this or any previous visit (from the past 240 hour(s)).   Radiology  Studies: No results found.  Scheduled Meds: . enalaprilat  0.625 mg Intravenous Q6H  . heparin  5,000 Units Subcutaneous Q8H  . saccharomyces boulardii  250 mg Oral BID   Continuous Infusions: . sodium chloride 75 mL/hr at 07/06/16 2127  . metronidazole 500 mg (07/07/16 1046)     LOS: 1 day   CHIU, Orpah Melter, MD [facility] Pager [phone]  If 7PM-7AM, please contact night-coverage [email or web address] Password [facility] 07/07/2016, 4:42 PM

## 2016-07-08 DIAGNOSIS — N200 Calculus of kidney: Secondary | ICD-10-CM | POA: Diagnosis not present

## 2016-07-08 DIAGNOSIS — K5732 Diverticulitis of large intestine without perforation or abscess without bleeding: Secondary | ICD-10-CM | POA: Diagnosis not present

## 2016-07-08 DIAGNOSIS — I10 Essential (primary) hypertension: Secondary | ICD-10-CM | POA: Diagnosis not present

## 2016-07-08 DIAGNOSIS — K5720 Diverticulitis of large intestine with perforation and abscess without bleeding: Secondary | ICD-10-CM | POA: Diagnosis not present

## 2016-07-08 MED ORDER — SACCHAROMYCES BOULARDII 250 MG PO CAPS: 250.0000 mg | ORAL_CAPSULE | Freq: Two times a day (BID) | ORAL | 0 refills | Status: DC

## 2016-07-08 MED ORDER — METRONIDAZOLE 500 MG PO TABS: 500.0000 mg | ORAL_TABLET | Freq: Three times a day (TID) | ORAL | 0 refills | Status: AC

## 2016-07-08 NOTE — Progress Notes (Signed)
Urology Inpatient Progress Report  Diverticulitis of large intestine with perforation, unspecified bleeding status [K57.20]   Intv/Subj: No acute events overnight. Patient is without complaint.  Principal Problem:   Diverticulitis of sigmoid colon Active Problems:   HTN (hypertension)   HLD (hyperlipidemia)   Kidney stone on left side   Sigmoid diverticulitis  Current Facility-Administered Medications  Medication Dose Route Frequency Provider Last Rate Last Dose  . 0.9 %  sodium chloride infusion   Intravenous Continuous Donne Hazel, MD 75 mL/hr at 07/08/16 0518    . acetaminophen (TYLENOL) tablet 650 mg  650 mg Oral Q6H PRN Donne Hazel, MD       Or  . acetaminophen (TYLENOL) suppository 650 mg  650 mg Rectal Q6H PRN Donne Hazel, MD      . diphenhydrAMINE (BENADRYL) capsule 50 mg  50 mg Oral Q6H PRN Reubin Milan, MD   50 mg at 07/08/16 0010  . enalaprilat (VASOTEC) injection 0.625 mg  0.625 mg Intravenous Q6H Donne Hazel, MD   0.625 mg at 07/08/16 1107  . heparin injection 5,000 Units  5,000 Units Subcutaneous Q8H Donne Hazel, MD   5,000 Units at 07/08/16 0522  . ketorolac (TORADOL) 30 MG/ML injection 30 mg  30 mg Intravenous Q6H PRN Donne Hazel, MD      . metroNIDAZOLE (FLAGYL) IVPB 500 mg  500 mg Intravenous Q8H Donne Hazel, MD   Stopped at 07/08/16 1206  . morphine 4 MG/ML injection 1 mg  1 mg Intravenous Q4H PRN Donne Hazel, MD   1 mg at 07/06/16 1854  . ondansetron (ZOFRAN) tablet 4 mg  4 mg Oral Q6H PRN Donne Hazel, MD       Or  . ondansetron [facility]) injection 4 mg  4 mg Intravenous Q6H PRN Donne Hazel, MD      . saccharomyces boulardii (FLORASTOR) capsule 250 mg  250 mg Oral BID Donne Hazel, MD   250 mg at 07/08/16 1106     Objective: Vital: Vitals:   07/07/16 0519 07/07/16 1312 07/07/16 2300 07/08/16 0520  BP: 139/86 (!) 145/76 136/78 134/84  Pulse: 62 63 (!) 57 (!) 58  Resp: 18 18 16 18   Temp: 97.8 F (36.6 C) 98  F (36.7 C) 97.9 F (36.6 C) 98 F (36.7 C)  TempSrc: Oral Oral Oral Oral  SpO2: 96% 99% 98% 96%  Weight:      Height:       I/Os: I/O last 3 completed shifts: In: 2672.5 [I.V.:2472.5; IV NLZJQBHAL:937] Out: -   Physical Exam:  General: Patient is in no apparent distress Lungs: Normal respiratory effort, chest expands symmetrically. No CVA tenderness   Lab Results:  Recent Labs  07/06/16 1613 07/07/16 0606  WBC 8.9 7.3  HGB 13.4 12.5*  HCT 38.9* 36.7*    Recent Labs  07/06/16 1613 07/07/16 0606  NA 140 140  K 4.0 4.0  CL 108 106  CO2 26 26  GLUCOSE 79 90  BUN 15 14  CREATININE 0.93 0.96  CALCIUM 9.0 8.7*   No results for input(s): LABPT, INR in the last 72 hours. No results for input(s): LABURIN in the last 72 hours. No results found for this or any previous visit.  Studies/Results: No results found.  Assessment: The patient has large burden left nonobstructing stone at this point with no pain. He is tolerating a regular diet without any pain associated with his perforated diverticulitis. This point, I  think the plan is for him to be discharged home. Plan: We will schedule the patient for elective left PCNL. We will contact the patient with the OR time and date. We will sign off for now, thank you for allowing [place] to participate in this patient's care.   Louis Meckel, MD Urology 07/08/2016, 12:21 PM

## 2016-07-08 NOTE — Progress Notes (Signed)
Patient ID: Gary Hurst, male   DOB: 1950-12-26, 66 y.o.   MRN: [id number] [facility] Progress Note:   * No surgery found *  Subjective: Mental status is clear.  Pain is gone Objective: Vital signs in last 24 hours: Temp:  [97.9 F (36.6 C)-98 F (36.7 C)] 98 F (36.7 C) (05/13 0520) Pulse Rate:  [57-63] 58 (05/13 0520) Resp:  [16-18] 18 (05/13 0520) BP: (134-145)/(76-84) 134/84 (05/13 0520) SpO2:  [96 %-99 %] 96 % (05/13 0520)  Intake/Output from previous day: 05/12 0701 - 05/13 0700 In: 1772.5 [I.V.:1672.5; IV Piggyback:100] Out: -  Intake/Output this shift: No intake/output data recorded.  Physical Exam: Work of breathing is normal.  Pain is gone.  No complaints.   Lab Results:  Results for orders placed or performed during the hospital encounter of 07/06/16 (from the past 48 hour(s))  Urinalysis, Routine w reflex microscopic     Status: Abnormal   Collection Time: 07/06/16  3:47 PM  Result Value Ref Range   Color, Urine YELLOW YELLOW   APPearance CLEAR CLEAR   Specific Gravity, Urine 1.020 1.005 - 1.030   pH 6.0 5.0 - 8.0   Glucose, UA NEGATIVE NEGATIVE mg/dL   Hgb urine dipstick LARGE (A) NEGATIVE   Bilirubin Urine NEGATIVE NEGATIVE   Ketones, ur NEGATIVE NEGATIVE mg/dL   Protein, ur 30 (A) NEGATIVE mg/dL   Nitrite NEGATIVE NEGATIVE   Leukocytes, UA NEGATIVE NEGATIVE   RBC / HPF TOO NUMEROUS TO COUNT 0 - 5 RBC/hpf   WBC, UA 6-30 0 - 5 WBC/hpf   Bacteria, UA NONE SEEN NONE SEEN   Squamous Epithelial / LPF 0-5 (A) NONE SEEN   Mucous PRESENT   CBC with Differential/Platelet     Status: Abnormal   Collection Time: 07/06/16  4:13 PM  Result Value Ref Range   WBC 8.9 4.0 - 10.5 K/uL   RBC 4.39 4.22 - 5.81 MIL/uL   Hemoglobin 13.4 13.0 - 17.0 g/dL   HCT 38.9 (L) 39.0 - 52.0 %   MCV 88.6 78.0 - 100.0 fL   MCH 30.5 26.0 - 34.0 pg   MCHC 34.4 30.0 - 36.0 g/dL   RDW 12.3 11.5 - 15.5 %   Platelets 212 150 - 400 K/uL   Neutrophils Relative % 60 %    Neutro Abs 5.4 1.7 - 7.7 K/uL   Lymphocytes Relative 32 %   Lymphs Abs 2.9 0.7 - 4.0 K/uL   Monocytes Relative 5 %   Monocytes Absolute 0.5 0.1 - 1.0 K/uL   Eosinophils Relative 2 %   Eosinophils Absolute 0.2 0.0 - 0.7 K/uL   Basophils Relative 1 %   Basophils Absolute 0.0 0.0 - 0.1 K/uL  Comprehensive metabolic panel     Status: None   Collection Time: 07/06/16  4:13 PM  Result Value Ref Range   Sodium 140 135 - 145 mmol/L   Potassium 4.0 3.5 - 5.1 mmol/L   Chloride 108 101 - 111 mmol/L   CO2 26 22 - 32 mmol/L   Glucose, Bld 79 65 - 99 mg/dL   BUN 15 6 - 20 mg/dL   Creatinine, Ser 0.93 0.61 - 1.24 mg/dL   Calcium 9.0 8.9 - 10.3 mg/dL   Total Protein 6.6 6.5 - 8.1 g/dL   Albumin 3.8 3.5 - 5.0 g/dL   AST 16 15 - 41 U/L   ALT 28 17 - 63 U/L   Alkaline Phosphatase 48 38 - 126 U/L   Total  Bilirubin 0.8 0.3 - 1.2 mg/dL   GFR calc non Af Amer >60 >60 mL/min   GFR calc Af Amer >60 >60 mL/min    Comment: (NOTE) The eGFR has been calculated using the CKD EPI equation. This calculation has not been validated in all clinical situations. eGFR's persistently <60 mL/min signify possible Chronic Kidney Disease.    Anion gap 6 5 - 15  HIV antibody (Routine Testing)     Status: None   Collection Time: 07/06/16  6:49 PM  Result Value Ref Range   HIV Screen 4th Generation wRfx Non Reactive Non Reactive    Comment: (NOTE) Performed At: [facility] [place], [place] [id number] Lindon Romp MD [id number]   Comprehensive metabolic panel     Status: Abnormal   Collection Time: 07/07/16  6:06 AM  Result Value Ref Range   Sodium 140 135 - 145 mmol/L   Potassium 4.0 3.5 - 5.1 mmol/L   Chloride 106 101 - 111 mmol/L   CO2 26 22 - 32 mmol/L   Glucose, Bld 90 65 - 99 mg/dL   BUN 14 6 - 20 mg/dL   Creatinine, Ser 0.96 0.61 - 1.24 mg/dL   Calcium 8.7 (L) 8.9 - 10.3 mg/dL   Total Protein 6.0 (L) 6.5 - 8.1 g/dL   Albumin 3.4 (L) 3.5 - 5.0 g/dL   AST 15 15 - 41 U/L    ALT 23 17 - 63 U/L   Alkaline Phosphatase 46 38 - 126 U/L   Total Bilirubin 1.1 0.3 - 1.2 mg/dL   GFR calc non Af Amer >60 >60 mL/min   GFR calc Af Amer >60 >60 mL/min    Comment: (NOTE) The eGFR has been calculated using the CKD EPI equation. This calculation has not been validated in all clinical situations. eGFR's persistently <60 mL/min signify possible Chronic Kidney Disease.    Anion gap 8 5 - 15  CBC     Status: Abnormal   Collection Time: 07/07/16  6:06 AM  Result Value Ref Range   WBC 7.3 4.0 - 10.5 K/uL   RBC 4.17 (L) 4.22 - 5.81 MIL/uL   Hemoglobin 12.5 (L) 13.0 - 17.0 g/dL   HCT 36.7 (L) 39.0 - 52.0 %   MCV 88.0 78.0 - 100.0 fL   MCH 30.0 26.0 - 34.0 pg   MCHC 34.1 30.0 - 36.0 g/dL   RDW 12.3 11.5 - 15.5 %   Platelets 215 150 - 400 K/uL    Radiology/Results: No results found.  Anti-infectives: Anti-infectives    Start     Dose/Rate Route Frequency Ordered Stop   07/07/16 1000  ciprofloxacin (CIPRO) IVPB 400 mg  Status:  Discontinued     400 mg 200 mL/hr over 60 Minutes Intravenous Every 12 hours 07/06/16 1735 07/07/16 1125   07/07/16 0000  ciprofloxacin (CIPRO) IVPB 400 mg     400 mg 200 mL/hr over 60 Minutes Intravenous  Once 07/06/16 1739 07/06/16 2338   07/06/16 1800  metroNIDAZOLE (FLAGYL) IVPB 500 mg     500 mg 100 mL/hr over 60 Minutes Intravenous Every 8 hours 07/06/16 1707     07/06/16 1600  ceFEPIme (MAXIPIME) 2 g in dextrose 5 % 50 mL IVPB     2 g 100 mL/hr over 30 Minutes Intravenous  Once 07/06/16 1556 07/06/16 1722   07/06/16 1600  metroNIDAZOLE (FLAGYL) IVPB 500 mg  Status:  Discontinued     500 mg 100 mL/hr over 60 Minutes Intravenous  Once 07/06/16 1556 07/06/16 1707      Assessment/Plan: Problem List: Patient Active Problem List   Diagnosis Date Noted  . HTN (hypertension) 07/06/2016  . HLD (hyperlipidemia) 07/06/2016  . Kidney stone on left side 07/06/2016  . Diverticulitis of sigmoid colon 07/06/2016  . Sigmoid diverticulitis  07/06/2016    OK to go home with liquid diet for a few days and cool off the diverticulitis before treating the kidney stones.   * No surgery found *    LOS: 2 days   Matt B. Hassell Done, MD, [facility] [phone] beeper [phone]  07/08/2016 9:46 AM

## 2016-07-08 NOTE — Discharge Summary (Signed)
Physician Discharge Summary  Gary Hurst WCB:[id number] DOB: [birth year] DOA: 07/06/2016  PCP: System, Provider Not In  Admit date: 07/06/2016 Discharge date: 07/08/2016  Admitted From: Home Disposition:  Home  Recommendations for Outpatient Follow-up:  1. Follow up with PCP in 2-3 weeks 2. Follow up with General Surgery as needed 3. Follow up with Urology as scheduled  Discharge Condition:Improved CODE STATUS:Full Diet recommendation: Full liquid x several days, slowly advance as tolerated   Brief/Interim Summary: 66 y.o.malewith medical history significant of HLD, HTN, renal stones who had been followed by Urology where pt underwent CT abd/pelvis on 5/11. CT scan noted an 52mm stone in the L renal pelvis. Scan also noted evidence of acute sigmoid diverticulitis with small contained perforation without abscess. Patient was referred to ED.   On further questioning, patient had hx of diverticulitis in 8/17, treated with abx with follow up by Dr. Watt Climes as outpt. Reports nausea, denies constipation or diarrhea.  ED Course:In the ED, CBC and CMP were ordered, still pending at this time. Patient was started on cipro and flagyl. General Surgery consulted. Hospitalist consulted for consideration for admission.   1. Sigmoid diverticulitis 1. CT abd/pelvis from OSH personally reviewed. Evidence of diverticulitis noted 2. Initially started on IV cipro and flagyl, however pt developed pruritis with cipro, thus has been discontinued 3. Patient improved on flagyl monotherapy  4. Have ordered probiotic 5. remains afebrile with no leukocytosis 6. General Surgery following, recs reviewed. No plans for surgery at this time 7. Patient tolerated clear liquid diet, to advance to full liquid x several days per General Surgery. Clear for d/c per surgery 2. HTN 1. BP remains stable 2. Resume home meds on d/c 3. HLD 1. Patient remains stable 2. Resume home meds on d/c 4. L sided kidney  stone 1. Patient remained stable 2. Discussed case with Urology. Plan for d/c today. Urology to contact patient to schedule f/u appointment as outpatient  Discharge Diagnoses:  Principal Problem:   Diverticulitis of sigmoid colon Active Problems:   HTN (hypertension)   HLD (hyperlipidemia)   Kidney stone on left side   Sigmoid diverticulitis    Discharge Instructions   Allergies as of 07/08/2016      Reactions   Penicillins Rash   Has patient had a PCN reaction causing immediate rash, facial/tongue/throat swelling, SOB or lightheadedness with hypotension: no Has patient had a PCN reaction causing severe rash involving mucus membranes or skin necrosis: yes Has patient had a PCN reaction occurring within the last 10 years: no If all of the above answers are "NO", then may proceed with Cephalosporin use.      Medication List    TAKE these medications   aspirin EC 81 MG tablet Take 81 mg by mouth daily.   atorvastatin 40 MG tablet Commonly known as:  LIPITOR Take 40 mg by mouth every evening.   CO Q 10 PO Take 1 tablet by mouth at bedtime.   FISH OIL PO Take 3 tablets by mouth daily.   metroNIDAZOLE 500 MG tablet Commonly known as:  FLAGYL Take 1 tablet (500 mg total) by mouth 3 (three) times daily.   oxycodone-acetaminophen 2.5-325 MG tablet Commonly known as:  PERCOCET Take 1 tablet by mouth every 6 (six) hours as needed for pain.   ramipril 10 MG capsule Commonly known as:  ALTACE Take 10 mg by mouth every morning.   saccharomyces boulardii 250 MG capsule Commonly known as:  FLORASTOR Take 1 capsule (250 mg total)  by mouth 2 (two) times daily.      Follow-up Information    [facility] [place] Follow up.   Specialty:  General Surgery Why:  as needed Contact information: [address] [place] [address] [phone]        Follow up with PCP in 2-3 weeks Follow up.        Alexis Frock, MD Follow up.   Specialty:   Urology Why:  as scheduled Contact information: [address] [place] [address] [phone]          Allergies  Allergen Reactions  . Penicillins Rash    Has patient had a PCN reaction causing immediate rash, facial/tongue/throat swelling, SOB or lightheadedness with hypotension: no Has patient had a PCN reaction causing severe rash involving mucus membranes or skin necrosis: yes Has patient had a PCN reaction occurring within the last 10 years: no If all of the above answers are "NO", then may proceed with Cephalosporin use.     Consultations:  General Surgery  Urology  Procedures/Studies: No results found.  Subjective: Feels better. Eager to go home  Discharge Exam: Vitals:   07/07/16 2300 07/08/16 0520  BP: 136/78 134/84  Pulse: (!) 57 (!) 58  Resp: 16 18  Temp: 97.9 F (36.6 C) 98 F (36.7 C)   Vitals:   07/07/16 0519 07/07/16 1312 07/07/16 2300 07/08/16 0520  BP: 139/86 (!) 145/76 136/78 134/84  Pulse: 62 63 (!) 57 (!) 58  Resp: 18 18 16 18   Temp: 97.8 F (36.6 C) 98 F (36.7 C) 97.9 F (36.6 C) 98 F (36.7 C)  TempSrc: Oral Oral Oral Oral  SpO2: 96% 99% 98% 96%  Weight:      Height:        General: Pt is alert, awake, not in acute distress Cardiovascular: RRR, S1/S2 +, no rubs, no gallops Respiratory: CTA bilaterally, no wheezing, no rhonchi Abdominal: Soft, NT, ND, bowel sounds + Extremities: no edema, no cyanosis   The results of significant diagnostics from this hospitalization (including imaging, microbiology, ancillary and laboratory) are listed below for reference.     Microbiology: No results found for this or any previous visit (from the past 240 hour(s)).   Labs: BNP (last 3 results) No results for input(s): BNP in the last 8760 hours. Basic Metabolic Panel:  Recent Labs Lab 07/06/16 1613 07/07/16 0606  NA 140 140  K 4.0 4.0  CL 108 106  CO2 26 26  GLUCOSE 79 90  BUN 15 14  CREATININE 0.93 0.96  CALCIUM 9.0  8.7*   Liver Function Tests:  Recent Labs Lab 07/06/16 1613 07/07/16 0606  AST 16 15  ALT 28 23  ALKPHOS 48 46  BILITOT 0.8 1.1  PROT 6.6 6.0*  ALBUMIN 3.8 3.4*   No results for input(s): LIPASE, AMYLASE in the last 168 hours. No results for input(s): AMMONIA in the last 168 hours. CBC:  Recent Labs Lab 07/06/16 1613 07/07/16 0606  WBC 8.9 7.3  NEUTROABS 5.4  --   HGB 13.4 12.5*  HCT 38.9* 36.7*  MCV 88.6 88.0  PLT 212 215   Cardiac Enzymes: No results for input(s): CKTOTAL, CKMB, CKMBINDEX, TROPONINI in the last 168 hours. BNP: Invalid input(s): POCBNP CBG: No results for input(s): GLUCAP in the last 168 hours. D-Dimer No results for input(s): DDIMER in the last 72 hours. Hgb A1c No results for input(s): HGBA1C in the last 72 hours. Lipid Profile No results for input(s):  CHOL, HDL, LDLCALC, TRIG, CHOLHDL, LDLDIRECT in the last 72 hours. Thyroid function studies No results for input(s): TSH, T4TOTAL, T3FREE, THYROIDAB in the last 72 hours.  Invalid input(s): FREET3 Anemia work up No results for input(s): VITAMINB12, FOLATE, FERRITIN, TIBC, IRON, RETICCTPCT in the last 72 hours. Urinalysis    Component Value Date/Time   COLORURINE YELLOW 07/06/2016 1547   APPEARANCEUR CLEAR 07/06/2016 1547   LABSPEC 1.020 07/06/2016 1547   PHURINE 6.0 07/06/2016 1547   GLUCOSEU NEGATIVE 07/06/2016 1547   HGBUR LARGE (A) 07/06/2016 1547   BILIRUBINUR NEGATIVE 07/06/2016 1547   KETONESUR NEGATIVE 07/06/2016 1547   PROTEINUR 30 (A) 07/06/2016 1547   UROBILINOGEN 1.0 08/29/2012 1118   NITRITE NEGATIVE 07/06/2016 1547   LEUKOCYTESUR NEGATIVE 07/06/2016 1547   Sepsis Labs Invalid input(s): PROCALCITONIN,  WBC,  LACTICIDVEN Microbiology No results found for this or any previous visit (from the past 240 hour(s)).   SIGNED:   Donne Hazel, MD  [facility] 07/08/2016, 12:27 PM  If 7PM-7AM, please contact night-coverage [email or web address] Password [email or web address]

## 2016-07-11 ENCOUNTER — Other Ambulatory Visit: Payer: Self-pay | Admitting: Urology

## 2016-07-25 ENCOUNTER — Encounter (HOSPITAL_BASED_OUTPATIENT_CLINIC_OR_DEPARTMENT_OTHER): Payer: Self-pay | Admitting: *Deleted

## 2016-07-25 NOTE — Progress Notes (Signed)
Pt instructed npo pmn 6/5 x pain med w sip of water. To [facility] 6/6 @ 1030.  Labs , ekg in epic.

## 2016-08-01 ENCOUNTER — Encounter (HOSPITAL_BASED_OUTPATIENT_CLINIC_OR_DEPARTMENT_OTHER)
Admission: RE | Disposition: A | Discharge status: Home / Self Care | Payer: Self-pay | Source: Ambulatory Visit | Attending: Urology

## 2016-08-01 ENCOUNTER — Encounter (HOSPITAL_BASED_OUTPATIENT_CLINIC_OR_DEPARTMENT_OTHER): Payer: Self-pay

## 2016-08-01 ENCOUNTER — Ambulatory Visit (HOSPITAL_BASED_OUTPATIENT_CLINIC_OR_DEPARTMENT_OTHER): Payer: 59 | Admitting: Anesthesiology

## 2016-08-01 ENCOUNTER — Ambulatory Visit (HOSPITAL_COMMUNITY)
Admission: RE | Admit: 2016-08-01 | Discharge: 2016-08-01 | Disposition: A | Discharge status: Home / Self Care | Payer: 59 | Source: Ambulatory Visit | Attending: Urology | Admitting: Urology

## 2016-08-01 DIAGNOSIS — I10 Essential (primary) hypertension: Secondary | ICD-10-CM | POA: Insufficient documentation

## 2016-08-01 DIAGNOSIS — Z87891 Personal history of nicotine dependence: Secondary | ICD-10-CM | POA: Diagnosis not present

## 2016-08-01 DIAGNOSIS — N202 Calculus of kidney with calculus of ureter: Secondary | ICD-10-CM | POA: Diagnosis present

## 2016-08-01 HISTORY — DX: Malignant (primary) neoplasm, unspecified: C80.1

## 2016-08-01 HISTORY — PX: CYSTOSCOPY/URETEROSCOPY/HOLMIUM LASER/STENT PLACEMENT: SHX6546

## 2016-08-01 HISTORY — PX: HOLMIUM LASER APPLICATION: SHX5852

## 2016-08-01 SURGERY — CYSTOSCOPY/URETEROSCOPY/HOLMIUM LASER/STENT PLACEMENT
Anesthesia: General | Site: Renal | Laterality: Left

## 2016-08-01 MED ORDER — IOHEXOL 300 MG/ML  SOLN
INTRAMUSCULAR | Status: DC | PRN
  Administered 2016-08-01: 15 mL

## 2016-08-01 MED ORDER — EPHEDRINE 5 MG/ML INJ
INTRAVENOUS | Status: AC
  Filled 2016-08-01: qty 10

## 2016-08-01 MED ORDER — ONDANSETRON HCL 4 MG/2ML IJ SOLN
INTRAMUSCULAR | Status: AC
  Filled 2016-08-01: qty 2

## 2016-08-01 MED ORDER — OXYCODONE HCL 5 MG PO TABS
5.0000 mg | ORAL_TABLET | Freq: Once | ORAL | Status: DC | PRN
  Filled 2016-08-01: qty 1

## 2016-08-01 MED ORDER — TRAMADOL HCL 50 MG PO TABS: 50.0000 mg | ORAL_TABLET | Freq: Four times a day (QID) | ORAL | 0 refills | Status: AC | PRN

## 2016-08-01 MED ORDER — SULFAMETHOXAZOLE-TRIMETHOPRIM 800-160 MG PO TABS: 1.0000 | ORAL_TABLET | Freq: Two times a day (BID) | ORAL | 0 refills | Status: DC

## 2016-08-01 MED ORDER — DEXAMETHASONE SODIUM PHOSPHATE 4 MG/ML IJ SOLN
INTRAMUSCULAR | Status: DC | PRN
  Administered 2016-08-01: 10 mg via INTRAVENOUS

## 2016-08-01 MED ORDER — SODIUM CHLORIDE 0.9 % IR SOLN
Status: DC | PRN
  Administered 2016-08-01: 4000 mL

## 2016-08-01 MED ORDER — KETOROLAC TROMETHAMINE 10 MG PO TABS: 10.0000 mg | ORAL_TABLET | Freq: Four times a day (QID) | ORAL | 1 refills | Status: DC | PRN

## 2016-08-01 MED ORDER — SENNOSIDES-DOCUSATE SODIUM 8.6-50 MG PO TABS: 1.0000 | ORAL_TABLET | Freq: Two times a day (BID) | ORAL | 0 refills | Status: DC

## 2016-08-01 MED ORDER — ARTIFICIAL TEARS OPHTHALMIC OINT
TOPICAL_OINTMENT | OPHTHALMIC | Status: AC
  Filled 2016-08-01: qty 3.5

## 2016-08-01 MED ORDER — LIDOCAINE HCL (CARDIAC) 20 MG/ML IV SOLN: INTRAVENOUS | Status: DC | PRN

## 2016-08-01 MED ORDER — FENTANYL CITRATE (PF) 100 MCG/2ML IJ SOLN
25.0000 ug | INTRAMUSCULAR | Status: DC | PRN
  Filled 2016-08-01: qty 1

## 2016-08-01 MED ORDER — FENTANYL CITRATE (PF) 100 MCG/2ML IJ SOLN
INTRAMUSCULAR | Status: AC
  Filled 2016-08-01: qty 2

## 2016-08-01 MED ORDER — LIDOCAINE 2% (20 MG/ML) 5 ML SYRINGE
INTRAMUSCULAR | Status: AC
  Filled 2016-08-01: qty 5

## 2016-08-01 MED ORDER — DEXAMETHASONE SODIUM PHOSPHATE 10 MG/ML IJ SOLN
INTRAMUSCULAR | Status: AC
  Filled 2016-08-01: qty 1

## 2016-08-01 MED ORDER — MIDAZOLAM HCL 2 MG/2ML IJ SOLN
INTRAMUSCULAR | Status: AC
  Filled 2016-08-01: qty 2

## 2016-08-01 MED ORDER — GENTAMICIN SULFATE 40 MG/ML IJ SOLN
5.0000 mg/kg | INTRAVENOUS | Status: AC
  Administered 2016-08-01: 440 mg via INTRAVENOUS
  Filled 2016-08-01 (×2): qty 11

## 2016-08-01 MED ORDER — OXYCODONE HCL 5 MG/5ML PO SOLN
5.0000 mg | Freq: Once | ORAL | Status: DC | PRN
  Filled 2016-08-01: qty 5

## 2016-08-01 MED ORDER — LACTATED RINGERS IV SOLN
INTRAVENOUS | Status: DC
  Filled 2016-08-01: qty 1000

## 2016-08-01 MED ORDER — PROPOFOL 10 MG/ML IV BOLUS
INTRAVENOUS | Status: DC | PRN
  Administered 2016-08-01: 200 mg via INTRAVENOUS

## 2016-08-01 MED ORDER — EPHEDRINE SULFATE-NACL 50-0.9 MG/10ML-% IV SOSY
PREFILLED_SYRINGE | INTRAVENOUS | Status: DC | PRN
  Administered 2016-08-01 (×4): 10 mg via INTRAVENOUS

## 2016-08-01 MED ORDER — FENTANYL CITRATE (PF) 100 MCG/2ML IJ SOLN
INTRAMUSCULAR | Status: DC | PRN
  Administered 2016-08-01 (×2): 50 ug via INTRAVENOUS

## 2016-08-01 MED ORDER — MEPERIDINE HCL 25 MG/ML IJ SOLN
6.2500 mg | INTRAMUSCULAR | Status: DC | PRN
  Filled 2016-08-01: qty 1

## 2016-08-01 MED ORDER — MIDAZOLAM HCL 5 MG/5ML IJ SOLN
INTRAMUSCULAR | Status: DC | PRN
  Administered 2016-08-01: 2 mg via INTRAVENOUS

## 2016-08-01 MED ORDER — LIDOCAINE 2% (20 MG/ML) 5 ML SYRINGE
INTRAMUSCULAR | Status: DC | PRN
  Administered 2016-08-01: 100 mg via INTRAVENOUS

## 2016-08-01 MED ORDER — PROPOFOL 10 MG/ML IV BOLUS
INTRAVENOUS | Status: AC
  Filled 2016-08-01: qty 40

## 2016-08-01 MED ORDER — ONDANSETRON HCL 4 MG/2ML IJ SOLN
INTRAMUSCULAR | Status: DC | PRN
  Administered 2016-08-01: 4 mg via INTRAVENOUS

## 2016-08-01 MED ORDER — PROMETHAZINE HCL 25 MG/ML IJ SOLN
6.2500 mg | INTRAMUSCULAR | Status: DC | PRN
Start: 2016-08-01 — End: 2016-08-01
  Filled 2016-08-01: qty 1

## 2016-08-01 MED ORDER — LACTATED RINGERS IV SOLN
INTRAVENOUS | Status: DC
  Administered 2016-08-01: 11:00:00 via INTRAVENOUS
  Filled 2016-08-01: qty 1000

## 2016-08-01 SURGICAL SUPPLY — 31 items
BAG DRAIN URO-CYSTO SKYTR STRL (DRAIN) ×3 IMPLANT
BAG DRN UROCATH (DRAIN) ×1
BASKET LASER NITINOL 1.9FR (BASKET) ×3 IMPLANT
BASKET ZERO TIP NITINOL 2.4FR (BASKET) IMPLANT
BSKT STON RTRVL 120 1.9FR (BASKET) ×1
BSKT STON RTRVL ZERO TP 2.4FR (BASKET)
CATH INTERMIT  6FR 70CM (CATHETERS) ×2 IMPLANT
CLOTH BEACON ORANGE TIMEOUT ST (SAFETY) ×6 IMPLANT
FIBER LASER TRAC TIP (UROLOGICAL SUPPLIES) ×2 IMPLANT
GLOVE BIO SURGEON STRL SZ7.5 (GLOVE) ×3 IMPLANT
GLOVE BIOGEL PI IND STRL 7.5 (GLOVE) IMPLANT
GLOVE BIOGEL PI INDICATOR 7.5 (GLOVE) ×4
GOWN STRL REUS W/ TWL LRG LVL3 (GOWN DISPOSABLE) ×2 IMPLANT
GOWN STRL REUS W/ TWL XL LVL3 (GOWN DISPOSABLE) ×1 IMPLANT
GOWN STRL REUS W/TWL LRG LVL3 (GOWN DISPOSABLE) ×10 IMPLANT
GOWN STRL REUS W/TWL XL LVL3 (GOWN DISPOSABLE)
GUIDEWIRE ANG ZIPWIRE 038X150 (WIRE) ×3 IMPLANT
GUIDEWIRE STR DUAL SENSOR (WIRE) ×3 IMPLANT
IV NS 1000ML (IV SOLUTION) ×3
IV NS 1000ML BAXH (IV SOLUTION) ×1 IMPLANT
IV NS IRRIG 3000ML ARTHROMATIC (IV SOLUTION) ×3 IMPLANT
KIT RM TURNOVER CYSTO AR (KITS) ×3 IMPLANT
MANIFOLD NEPTUNE II (INSTRUMENTS) ×2 IMPLANT
NS IRRIG 500ML POUR BTL (IV SOLUTION) ×3 IMPLANT
PACK CYSTO (CUSTOM PROCEDURE TRAY) ×3 IMPLANT
SHEATH ACCESS URETERAL 38CM (SHEATH) ×2 IMPLANT
STENT POLARIS 5FRX26 (STENTS) ×2 IMPLANT
SYRINGE 10CC LL (SYRINGE) ×3 IMPLANT
TUBE CONNECTING 12'X1/4 (SUCTIONS)
TUBE CONNECTING 12X1/4 (SUCTIONS) IMPLANT
TUBE FEEDING 8FR 16IN STR KANG (MISCELLANEOUS) ×3 IMPLANT

## 2016-08-01 NOTE — Brief Op Note (Signed)
08/01/2016  12:47 PM  PATIENT:  Gary Hurst  66 y.o. male  PRE-OPERATIVE DIAGNOSIS:  LEFT RENAL STONE  POST-OPERATIVE DIAGNOSIS:  LEFT RENAL STONE  PROCEDURE:  Procedure(s): CYSTOSCOPY/ RETROGRADE/URETEROSCOPY/HOLMIUM LASER STONE BASKETRY//STENT PLACEMENT (Left) HOLMIUM LASER APPLICATION (Left)  SURGEON:  Surgeon(s) and Role:    Alexis Frock, MD - Primary  PHYSICIAN ASSISTANT:   ASSISTANTS: none   ANESTHESIA:   general  EBL:  Total I/O In: 700 [I.V.:700] Out: 10 [Blood:10]  BLOOD ADMINISTERED:none  DRAINS: none   LOCAL MEDICATIONS USED:  NONE  SPECIMEN:  Source of Specimen:  Left renal stone fragments  DISPOSITION OF SPECIMEN:  [facility] for compositional analysis  COUNTS:  YES  TOURNIQUET:  * No tourniquets in log *  DICTATION: .Other Dictation: Dictation Number [id number]  PLAN OF CARE: Discharge to home after PACU  PATIENT DISPOSITION:  PACU - hemodynamically stable.   Delay start of Pharmacological VTE agent (>24hrs) due to surgical blood loss or risk of bleeding: yes

## 2016-08-01 NOTE — Interval H&P Note (Signed)
History and Physical Interval Note:  08/01/2016 11:37 AM  Gary Hurst  has presented today for surgery, with the diagnosis of LEFT RENAL STONE  The various methods of treatment have been discussed with the patient and family. After consideration of risks, benefits and other options for treatment, the patient has consented to  Procedure(s): CYSTOSCOPY/ RETROGRADE/URETEROSCOPY/HOLMIUM LASER/STENT PLACEMENT (Left) as a surgical intervention .  The patient's history has been reviewed, patient examined, no change in status, stable for surgery.  I have reviewed the patient's chart and labs.  Questions were answered to the patient's satisfaction.     Christella App

## 2016-08-01 NOTE — H&P (Signed)
Gary Hurst is an 66 y.o. male.    Chief Complaint: Pre-op LEFT Ureteroscopic Stone Manipulation  HPI:   1 - Left Renal / UPJ Stones - 74mm left UPJ stone and several ipsilateral stones (total vol 68mm) byh ER CT 06/2016 on eval intermitant flank pain.   Most recent UA without infectious parameters.  Today "Gary Hurst" is seen to proceed with LEFT ureteroscopic stone manipulation. NO interval fevers.   Past Medical History:  Diagnosis Date  . Cancer (HCC)    hx of skin cancer  . Diverticulitis   . History of kidney stones    left  . Hyperlipidemia   . Hypertension   . Renal calculi   . Right ureteral stone     Past Surgical History:  Procedure Laterality Date  . CYSTOSCOPY WITH RETROGRADE PYELOGRAM, URETEROSCOPY AND STENT PLACEMENT Right 09/04/2012   Procedure: CYSTOSCOPY WITH  RIGHT  RETROGRADE PYELOGRAM, DIGITAL URETEROSCOPY AND STENT PLACEMENT;  Surgeon: Alexis Frock, MD;  Location: [facility];  Service: Urology;  Laterality: Right;  . EXTRACORPOREAL SHOCK WAVE LITHOTRIPSY     X2  . HOLMIUM LASER APPLICATION Right 7/32/2025   Procedure: HOLMIUM LASER APPLICATION;  Surgeon: Alexis Frock, MD;  Location: [facility];  Service: Urology;  Laterality: Right;  . LAPAROSCOPIC CHOLECYSTECTOMY  09-05-2006  . TONSILLECTOMY  AGE 60    Family History  Problem Relation Age of Onset  . Parkinson's disease Mother   . Hypertension Mother   . Hyperlipidemia Mother   . Kidney Stones Father   . Alzheimer's disease Father   . Kidney Stones Sister   . Kidney Stones Brother    Social History:  reports that he quit smoking about 22 years ago. His smoking use included Cigarettes. He quit after 20.00 years of use. He has never used smokeless tobacco. He reports that he drinks alcohol. He reports that he does not use drugs.  Allergies:  Allergies  Allergen Reactions  . Penicillins Rash    Has patient had a PCN reaction causing immediate rash,  facial/tongue/throat swelling, SOB or lightheadedness with hypotension: no Has patient had a PCN reaction causing severe rash involving mucus membranes or skin necrosis: yes Has patient had a PCN reaction occurring within the last 10 years: no If all of the above answers are "NO", then may proceed with Cephalosporin use.     No prescriptions prior to admission.    No results found for this or any previous visit (from the past 48 hour(s)). No results found.  Review of Systems  Constitutional: Negative.  Negative for chills and fever.  HENT: Negative.   Eyes: Negative.   Respiratory: Negative.   Cardiovascular: Negative.   Gastrointestinal: Negative.   Genitourinary: Negative.   Musculoskeletal: Negative.   Skin: Negative.   Neurological: Negative.   Endo/Heme/Allergies: Negative.   Psychiatric/Behavioral: Negative.     Height 5\' 8"  (1.727 m), weight 82.6 kg (182 lb). Physical Exam  Constitutional: He appears well-developed.  HENT:  Head: Normocephalic.  Eyes: Pupils are equal, round, and reactive to light.  Neck: Normal range of motion.  Cardiovascular: Normal rate.   Respiratory: Effort normal.  GI: Soft.  Genitourinary:  Genitourinary Comments: Mild left CVAT  Musculoskeletal: Normal range of motion.  Neurological: He is alert.  Skin: Skin is warm.  Psychiatric: He has a normal mood and affect.     Assessment/Plan  Proceed as planned with LEFT ureteroscopy. Risks, benefits, alternatives, expected peri-op course discussed previously and reiterated today.  Alexis Frock, MD 08/01/2016, 6:56 AM

## 2016-08-01 NOTE — Anesthesia Preprocedure Evaluation (Addendum)
Anesthesia Evaluation  Patient identified by MRN, date of birth, ID band Patient awake    Reviewed: Allergy & Precautions, NPO status , Patient's Chart, lab work & pertinent test results  Airway Mallampati: II  TM Distance: >3 FB Neck ROM: Full    Dental  (+) Teeth Intact, Dental Advisory Given   Pulmonary former smoker,    breath sounds clear to auscultation       Cardiovascular hypertension, Pt. on medications  Rhythm:Regular Rate:Normal     Neuro/Psych negative neurological ROS  negative psych ROS   GI/Hepatic negative GI ROS, Neg liver ROS,   Endo/Other  negative endocrine ROS  Renal/GU Renal disease  negative genitourinary   Musculoskeletal negative musculoskeletal ROS (+)   Abdominal   Peds negative pediatric ROS (+)  Hematology negative hematology ROS (+)   Anesthesia Other Findings   Reproductive/Obstetrics negative OB ROS                            EKG: sinus bradycardia.  Anesthesia Physical Anesthesia Plan  ASA: II  Anesthesia Plan: General   Post-op Pain Management:    Induction: Intravenous  PONV Risk Score and Plan: 2 and Ondansetron, Dexamethasone and Treatment may vary due to age  Airway Management Planned: LMA  Additional Equipment:   Intra-op Plan:   Post-operative Plan: Extubation in OR  Informed Consent: I have reviewed the patients History and Physical, chart, labs and discussed the procedure including the risks, benefits and alternatives for the proposed anesthesia with the patient or authorized representative who has indicated his/her understanding and acceptance.   Dental advisory given  Plan Discussed with: CRNA  Anesthesia Plan Comments:         Anesthesia Quick Evaluation

## 2016-08-01 NOTE — Op Note (Signed)
NAME:  Gary Hurst, Gary Hurst                  ACCOUNT NO.:  MEDICAL RECORD NO.:  [id number]  LOCATION:                                 FACILITY:  PHYSICIAN:  Alexis Frock, MD          DATE OF BIRTH:   DATE OF PROCEDURE: 08/01/2016                               OPERATIVE REPORT   PREOPERATIVE DIAGNOSES:  Left renal stone with intermittent obstruction.  PROCEDURE: 1. Cystoscopy, left retrograde pyelogram interpretation. 2. Left ureteroscopy with laser lithotripsy. 3. Insertion of left ureteral stent, 5 x 26 Polaris, no tether.  ESTIMATED BLOOD LOSS:  Nil.  COMPLICATION:  None.  SPECIMEN:  Left renal stone fragments to compositional analysis.  FINDINGS: 1. Likely ball-valving left UPJ stone, approximately 9 mm. 2. Significant volume intrarenal stone, upper, mid, and lower poles.     Total left-sided stone volume approximately 13 mm. 3. Complete resolution of all stone fragments larger than one-third     millimeter following laser lithotripsy and basket extraction. 4. Successful placement of left ureteral stent, proximal in renal     pelvis and distal in the urinary bladder.  INDICATION:  Gary Hurst is a 66 year old gentleman with history of recurrent nephrolithiasis.  He was found on workup with colicky left flank pain to have likely ball valving, intermittent obstruction of his left kidney from a large UPJ stone.  He had multifocal renal stones as well.  Options were discussed for management including surveillance versus shockwave lithotripsy versus ureteroscopy, and he wished to proceed with the latter with goal of left-sided stone free.  Informed consent was obtained and placed in the medical record.  PROCEDURE IN DETAIL:  The patient being Gary Hurst, procedure being left ureteroscopic stone manipulation was confirmed.  Procedure was carried out.  Time-out was performed.  Intravenous antibiotics were administered.  General anesthesia introduced.  The patient  placed into a low lithotomy position.  A sterile field was created by prepping and draping the patient's penis, perineum, proximal thighs using iodine. Next, cystourethroscopy was performed using a 22-French rigid cystoscope with offset lens.  Inspection of the anterior and posterior urethra were unremarkable.  Inspection of the bladder revealed no diverticula, calcifications, papillary lesions.  Ureteral orifices were singleton bilaterally.  The left ureteral orifice was cannulated with a 6-French end-hole catheter and left retrograde pyelogram was obtained.  Left retrograde pyelogram demonstrated single left ureter with single- system left kidney.  There was a filling defect in proximal ureter UPJ area consistent with known stone likely intermittent ball valving.  A 0.038 Zip wire was advanced at the level of the upper pole, set aside as a safety wire.  An 8-French feeding tube placed in urinary bladder for pressure release, and semi-rigid ureteroscopy was performed at the distal four-fifths of the left ureter alongside a separate Sensor working wire.  No mucosal abnormalities were found.  The semi-rigid scope was exchanged for a 12/14, 38-cm ureteral access sheath at the level of proximal ureter using continuous fluoroscopic guidance and flexible digital ureteroscopy was performed at the proximal left ureter and systematic inspection of the left kidney including all calices x3. Multifocal intrarenal stones were seen including a  large likely ball- valving UPJ stone.  This stone as well as a lower mid-pole stone appeared to be too large for simple basketing.  The UPJ stone was repositioned into the upper pole, and laser lithotripsy was applied using settings of 0.2 joules and 30 Hz, and a dusting technique was used to ablate approximately 70% of the stone.  Dusting technique was also used to dust approximately 70% of volume of the lower mid-pole stone. All remaining fragments were  either smaller than one-third millimeter or amenable to basketing, and basket extraction was performed using an escape-type basket for all stone fragments larger than one-third millimeter.  Following this, there was excellent hemostasis.  No evidence of perforation.  There was complete resolution of all stone fragments larger than one-third millimeter.  Inspection of all calices again.  The access sheath was removed under continuous vision.  Given the large volume of stone and dusting technique, it was felt that interval stenting with a non-tethered stent would be warranted.  As such, a new 5 x 26 Polaris-type stent was placed with remaining safety wire using cystoscopic and fluoroscopic guidance.  Good proximal and distal deployment were noted.  Bladder was emptied per cystoscope, procedure was then terminated.  The patient tolerated procedure well. No immediate periprocedural complications.  The patient was taken to the postanesthesia care in stable condition.          ______________________________ Alexis Frock, MD     TM/MEDQ  D:  08/01/2016  T:  08/01/2016  Job:  [id number]

## 2016-08-01 NOTE — Transfer of Care (Signed)
  Last Vitals:  Vitals:   08/01/16 1034 08/01/16 1256  BP: 136/75 126/66  Pulse: (!) 59 63  Resp: 16 (!) 22  Temp: 36.4 C 36.9 C    Last Pain:  Vitals:   08/01/16 1036  TempSrc:   PainSc: 6         Immediate Anesthesia Transfer of Care Note  Patient: Gary Hurst  Procedure(s) Performed: Procedure(s) (LRB): CYSTOSCOPY/ RETROGRADE/URETEROSCOPY/HOLMIUM LASER STONE BASKETRY//STENT PLACEMENT (Left) HOLMIUM LASER APPLICATION (Left)  Patient Location: PACU  Anesthesia Type: General  Level of Consciousness: awake, alert  and oriented  Airway & Oxygen Therapy: Patient Spontanous Breathing and Patient connected to face mask oxygen  Post-op Assessment: Report given to PACU RN and Post -op Vital signs reviewed and stable  Post vital signs: Reviewed and stable  Complications: No apparent anesthesia complications

## 2016-08-01 NOTE — Anesthesia Procedure Notes (Signed)
Procedure Name: LMA Insertion Date/Time: 08/01/2016 11:54 AM Performed by: Suella Broad D Pre-anesthesia Checklist: Patient identified, Emergency Drugs available, Suction available and Patient being monitored Patient Re-evaluated:Patient Re-evaluated prior to inductionOxygen Delivery Method: Circle system utilized Preoxygenation: Pre-oxygenation with 100% oxygen Intubation Type: IV induction Ventilation: Mask ventilation without difficulty LMA: LMA inserted LMA Size: 5.0 Number of attempts: 1 Airway Equipment and Method: Bite block Placement Confirmation: positive ETCO2 Tube secured with: Tape Dental Injury: Teeth and Oropharynx as per pre-operative assessment

## 2016-08-01 NOTE — Discharge Instructions (Signed)
HOME CARE INSTRUCTIONS FOR SCROTAL PROCEDURES  Wound Care & Hygiene: You may apply an ice bag to the scrotum for the first 24 hours.  This may help decrease swelling and soreness.  You may have a dressing held in place by an athletic supporter.  You may remove the dressing in 24 hours and shower in 48 hours.  Continue to use the athletic supporter or tight briefs for at least a week. Activity: Rest today - not necessarily flat bed rest.  Just take it easy.  You should not do strenuous activities until your follow-up visit with your doctor.  You may resume light activity in 48 hours.  Return to Work:  Your doctor will advise you of this depending on the type of work you do  Diet: Drink liquids or eat a light diet this evening.  You may resume a regular diet tomorrow.  General Expectations: You may have a small amount of bleeding.  The scrotum may be swollen or bruised for about a week. [facility] [phone] Post Ureteroscopy With or Without Stent Instructions  Definitions:  Ureter: The duct that transports urine from the kidney to the bladder. Stent:   A plastic hollow tube that is placed into the ureter, from the kidney to the                 bladder to prevent the ureter from swelling shut.  GENERAL INSTRUCTIONS:  Despite the fact that no skin incisions were used, the area around the ureter and bladder is raw and irritated. The stent is a foreign body which will further irritate the bladder wall. This irritation is manifested by increased frequency of urination, both day and night, and by an increase in the urge to urinate. In some, the urge to urinate is present almost always. Sometimes the urge is strong enough that you may not be able to stop yourself from urinating. The only real cure is to remove the stent and then give time for the bladder wall to heal which can't be done until the danger of the ureter swelling shut has passed, which varies.  You may see some  blood in your urine while the stent is in place and a few days afterwards. Do not be alarmed, even if the urine was clear for a while. Get off your feet and drink lots of fluids until clearing occurs. If you start to pass clots or don't improve, call us.  DIET: You may return to your normal diet immediately. Because of the raw surface of your bladder, alcohol, spicy foods, acid type foods and drinks with caffeine may cause irritation or frequency and should be used in moderation. To keep your urine flowing freely and to avoid constipation, drink plenty of fluids during the day ( 8-10 glasses ). Tip: Avoid cranberry juice because it is very acidic.  ACTIVITY: Your physical activity doesn't need to be restricted. However, if you are very active, you may see some blood in your urine. We suggest that you reduce your activity under these circumstances until the bleeding has stopped.  BOWELS: It is important to keep your bowels regular during the postoperative period. Straining with bowel movements can cause bleeding. A bowel movement every other day is reasonable. Use a mild laxative if needed, such as Milk of Magnesia 2-3 tablespoons, or 2 Dulcolax tablets. Call if you continue to have problems. If you have been taking narcotics for pain, before, during or after your surgery, you may be constipated. Take  a laxative if necessary.   MEDICATION: You should resume your pre-surgery medications unless told not to. In addition you will often be given an antibiotic to prevent infection. These should be taken as prescribed until the bottles are finished unless you are having an unusual reaction to one of the drugs.  PROBLEMS YOU SHOULD REPORT TO [place]:  Fevers over 100.5 Fahrenheit.  Heavy bleeding, or clots ( See above notes about blood in urine ).  Inability to urinate.  Drug reactions ( hives, rash, nausea, vomiting, diarrhea ).  Severe burning or pain with urination that is not  improving.  FOLLOW-UP: You will need a follow-up appointment to monitor your progress. Call for this appointment at the number listed above. Usually the first appointment will be about three to fourteen days after your surgery.      Post Anesthesia Home Care Instructions  Activity: Get plenty of rest for the remainder of the day. A responsible individual must stay with you for 24 hours following the procedure.  For the next 24 hours, DO NOT: -Drive a car -Paediatric nurse -Drink alcoholic beverages -Take any medication unless instructed by your physician -Make any legal decisions or sign important papers.  Meals: Start with liquid foods such as gelatin or soup. Progress to regular foods as tolerated. Avoid greasy, spicy, heavy foods. If nausea and/or vomiting occur, drink only clear liquids until the nausea and/or vomiting subsides. Call your physician if vomiting continues.  Special Instructions/Symptoms: Your throat may feel dry or sore from the anesthesia or the breathing tube placed in your throat during surgery. If this causes discomfort, gargle with warm salt water. The discomfort should disappear within 24 hours.  If you had a scopolamine patch placed behind your ear for the management of post- operative nausea and/or vomiting:  1. The medication in the patch is effective for 72 hours, after which it should be removed.  Wrap patch in a tissue and discard in the trash. Wash hands thoroughly with soap and water. 2. You may remove the patch earlier than 72 hours if you experience unpleasant side effects which may include dry mouth, dizziness or visual disturbances. 3. Avoid touching the patch. Wash your hands with soap and water after contact with the patch.    1 - You may have urinary urgency (bladder spasms) and bloody urine on / off with stent in place. This is normal.  2 - Call MD or go to ER for fever >102, severe pain / nausea / vomiting not relieved by medications, or  acute change in medical status

## 2016-08-02 ENCOUNTER — Encounter (HOSPITAL_BASED_OUTPATIENT_CLINIC_OR_DEPARTMENT_OTHER): Payer: Self-pay | Admitting: Urology

## 2016-08-03 NOTE — Anesthesia Postprocedure Evaluation (Signed)
Anesthesia Post Note  Patient: Gary Hurst  Procedure(s) Performed: Procedure(s) (LRB): CYSTOSCOPY/ RETROGRADE/URETEROSCOPY/HOLMIUM LASER STONE BASKETRY//STENT PLACEMENT (Left) HOLMIUM LASER APPLICATION (Left)     Patient location during evaluation: PACU Anesthesia Type: General Level of consciousness: awake and alert Pain management: pain level controlled Vital Signs Assessment: post-procedure vital signs reviewed and stable Respiratory status: spontaneous breathing, nonlabored ventilation, respiratory function stable and patient connected to nasal cannula oxygen Cardiovascular status: blood pressure returned to baseline and stable Postop Assessment: no signs of nausea or vomiting Anesthetic complications: no    Last Vitals:  Vitals:   08/01/16 1330 08/01/16 1400  BP: 125/68 134/74  Pulse: (!) 57 (!) 59  Resp: 18 16  Temp:  36.7 C    Last Pain:  Vitals:   08/02/16 0852  TempSrc:   PainSc: [place]

## 2017-03-06 ENCOUNTER — Other Ambulatory Visit: Payer: Self-pay | Admitting: Family Medicine

## 2017-03-06 DIAGNOSIS — R51 Headache: Principal | ICD-10-CM

## 2017-03-06 DIAGNOSIS — R519 Headache, unspecified: Secondary | ICD-10-CM

## 2018-04-28 ENCOUNTER — Other Ambulatory Visit: Payer: Self-pay | Admitting: Dermatology

## 2019-03-10 ENCOUNTER — Other Ambulatory Visit: Payer: Self-pay | Admitting: Urology

## 2019-03-25 ENCOUNTER — Other Ambulatory Visit: Payer: Self-pay | Admitting: Urology

## 2019-03-27 ENCOUNTER — Ambulatory Visit (HOSPITAL_BASED_OUTPATIENT_CLINIC_OR_DEPARTMENT_OTHER): Admit: 2019-03-27 | Payer: 59 | Admitting: Urology

## 2019-03-27 ENCOUNTER — Encounter (HOSPITAL_BASED_OUTPATIENT_CLINIC_OR_DEPARTMENT_OTHER): Payer: Self-pay

## 2019-03-27 SURGERY — CYSTOURETEROSCOPY, WITH RETROGRADE PYELOGRAM AND STENT INSERTION
Anesthesia: General | Laterality: Bilateral

## 2019-04-23 ENCOUNTER — Ambulatory Visit: Payer: Self-pay | Attending: Internal Medicine

## 2019-04-23 DIAGNOSIS — Z23 Encounter for immunization: Secondary | ICD-10-CM | POA: Insufficient documentation

## 2019-04-23 NOTE — Progress Notes (Signed)
   Covid-19 Vaccination Clinic  Name:  Gary Hurst    MRN: [id number] DOB: June 19, 1950  04/23/2019  [name] was observed post Covid-19 immunization for 15 minutes without incidence. He was provided with Vaccine Information Sheet and instruction to access the V-Safe system.   [name] was instructed to call 911 with any severe reactions post vaccine: Marland Kitchen Difficulty breathing  . Swelling of your face and throat  . A fast heartbeat  . A bad rash all over your body  . Dizziness and weakness    Immunizations Administered    Name Date Dose VIS Date Route   Pfizer COVID-19 Vaccine 04/23/2019 11:10 AM 0.3 mL 02/06/2019 Intramuscular   Manufacturer: [place]   Lot: [id number]   [place]: [id number]

## 2019-05-13 ENCOUNTER — Ambulatory Visit: Payer: Self-pay | Attending: Internal Medicine

## 2019-05-13 DIAGNOSIS — Z23 Encounter for immunization: Secondary | ICD-10-CM

## 2019-05-13 NOTE — Progress Notes (Signed)
   Covid-19 Vaccination Clinic  Name:  GUTHRIE CRISP    MRN: [id number] DOB: 1950/09/04  05/13/2019  Mr. Estella was observed post Covid-19 immunization for 15 minutes without incident. He was provided with Vaccine Information Sheet and instruction to access the V-Safe system.   Mr. Inselman was instructed to call 911 with any severe reactions post vaccine: Marland Kitchen Difficulty breathing  . Swelling of face and throat  . A fast heartbeat  . A bad rash all over body  . Dizziness and weakness   Immunizations Administered    Name Date Dose VIS Date Route   Pfizer COVID-19 Vaccine 05/13/2019 12:47 PM 0.3 mL 02/06/2019 Intramuscular   Manufacturer: [place]   Lot: [id number]   [place]: [id number]

## 2020-01-29 ENCOUNTER — Other Ambulatory Visit: Payer: Self-pay | Admitting: Urology

## 2020-02-02 ENCOUNTER — Other Ambulatory Visit: Payer: Self-pay

## 2020-02-02 ENCOUNTER — Encounter (HOSPITAL_BASED_OUTPATIENT_CLINIC_OR_DEPARTMENT_OTHER): Payer: Self-pay | Admitting: Urology

## 2020-02-02 NOTE — Progress Notes (Signed)
Spoke w/ via phone for pre-op interview---pt Lab needs dos----    I stat, ekg           Lab results------none COVID test ------02-08-2020 900 a, Arrive at -------1300 pm 02-10-2020 NPO after MN NO Solid Food.  Clear liquids from MN until---1200 pm Medications to take morning of surgery -----imdapamide (pt to take doxycycline after surgery due to must be taken with food) Diabetic medication -----n/a Patient Special Instructions -----none Pre-Op special Istructions -----none Patient verbalized understanding of instructions that were given at this phone interview. Patient denies shortness of breath, chest pain, fever, cough at this phone interview.

## 2020-02-08 ENCOUNTER — Other Ambulatory Visit (HOSPITAL_COMMUNITY)
Admission: RE | Admit: 2020-02-08 | Discharge: 2020-02-08 | Disposition: A | Discharge status: Home / Self Care | Payer: Medicare Other | Source: Ambulatory Visit | Attending: Urology | Admitting: Urology

## 2020-02-08 DIAGNOSIS — Z20822 Contact with and (suspected) exposure to covid-19: Secondary | ICD-10-CM | POA: Insufficient documentation

## 2020-02-08 DIAGNOSIS — Z01812 Encounter for preprocedural laboratory examination: Secondary | ICD-10-CM | POA: Insufficient documentation

## 2020-02-08 LAB — SARS CORONAVIRUS 2 (TAT 6-24 HRS): SARS Coronavirus 2: NEGATIVE

## 2020-02-10 ENCOUNTER — Encounter (HOSPITAL_BASED_OUTPATIENT_CLINIC_OR_DEPARTMENT_OTHER)
Admission: RE | Disposition: A | Discharge status: Home / Self Care | Payer: Self-pay | Source: Home / Self Care | Attending: Urology

## 2020-02-10 ENCOUNTER — Ambulatory Visit (HOSPITAL_BASED_OUTPATIENT_CLINIC_OR_DEPARTMENT_OTHER): Payer: Medicare Other | Admitting: Anesthesiology

## 2020-02-10 ENCOUNTER — Encounter (HOSPITAL_BASED_OUTPATIENT_CLINIC_OR_DEPARTMENT_OTHER): Payer: Self-pay | Admitting: Urology

## 2020-02-10 ENCOUNTER — Other Ambulatory Visit: Payer: Self-pay

## 2020-02-10 ENCOUNTER — Ambulatory Visit (HOSPITAL_COMMUNITY)
Admission: RE | Admit: 2020-02-10 | Discharge: 2020-02-10 | Disposition: A | Discharge status: Home / Self Care | Payer: Medicare Other | Attending: Urology | Admitting: Urology

## 2020-02-10 DIAGNOSIS — Z85828 Personal history of other malignant neoplasm of skin: Secondary | ICD-10-CM | POA: Insufficient documentation

## 2020-02-10 DIAGNOSIS — Z87891 Personal history of nicotine dependence: Secondary | ICD-10-CM | POA: Insufficient documentation

## 2020-02-10 DIAGNOSIS — Z87442 Personal history of urinary calculi: Secondary | ICD-10-CM | POA: Insufficient documentation

## 2020-02-10 DIAGNOSIS — Z7982 Long term (current) use of aspirin: Secondary | ICD-10-CM | POA: Diagnosis not present

## 2020-02-10 DIAGNOSIS — N200 Calculus of kidney: Secondary | ICD-10-CM | POA: Diagnosis not present

## 2020-02-10 DIAGNOSIS — Z79899 Other long term (current) drug therapy: Secondary | ICD-10-CM | POA: Diagnosis not present

## 2020-02-10 DIAGNOSIS — R82994 Hypercalciuria: Secondary | ICD-10-CM | POA: Diagnosis not present

## 2020-02-10 HISTORY — PX: CYSTOSCOPY WITH RETROGRADE PYELOGRAM, URETEROSCOPY AND STENT PLACEMENT: SHX5789

## 2020-02-10 HISTORY — PX: HOLMIUM LASER APPLICATION: SHX5852

## 2020-02-10 LAB — POCT I-STAT, CHEM 8
BUN: 18 mg/dL (ref 8–23)
Calcium, Ion: 1.1 mmol/L — ABNORMAL LOW (ref 1.15–1.40)
Chloride: 99 mmol/L (ref 98–111)
Creatinine, Ser: 0.9 mg/dL (ref 0.61–1.24)
Glucose, Bld: 93 mg/dL (ref 70–99)
HCT: 41 % (ref 39.0–52.0)
Hemoglobin: 13.9 g/dL (ref 13.0–17.0)
Potassium: 3.1 mmol/L — ABNORMAL LOW (ref 3.5–5.1)
Sodium: 137 mmol/L (ref 135–145)
TCO2: 26 mmol/L (ref 22–32)

## 2020-02-10 SURGERY — CYSTOURETEROSCOPY, WITH RETROGRADE PYELOGRAM AND STENT INSERTION
Anesthesia: General | Site: Renal | Laterality: Bilateral

## 2020-02-10 MED ORDER — PROPOFOL 10 MG/ML IV BOLUS
INTRAVENOUS | Status: AC
  Filled 2020-02-10: qty 20

## 2020-02-10 MED ORDER — ACETAMINOPHEN 500 MG PO TABS
1000.0000 mg | ORAL_TABLET | Freq: Once | ORAL | Status: AC
  Administered 2020-02-10: 14:00:00 1000 mg via ORAL

## 2020-02-10 MED ORDER — GENTAMICIN SULFATE 40 MG/ML IJ SOLN
5.0000 mg/kg | INTRAVENOUS | Status: AC
  Administered 2020-02-10: 15:00:00 400 mg via INTRAVENOUS
  Filled 2020-02-10: qty 10

## 2020-02-10 MED ORDER — SODIUM CHLORIDE 0.9 % IR SOLN
Status: DC | PRN
  Administered 2020-02-10: 3000 mL

## 2020-02-10 MED ORDER — KETOROLAC TROMETHAMINE 10 MG PO TABS: 10.0000 mg | ORAL_TABLET | Freq: Three times a day (TID) | ORAL | 0 refills | Status: DC | PRN

## 2020-02-10 MED ORDER — DEXAMETHASONE SODIUM PHOSPHATE 10 MG/ML IJ SOLN
INTRAMUSCULAR | Status: AC
  Filled 2020-02-10: qty 1

## 2020-02-10 MED ORDER — FENTANYL CITRATE (PF) 100 MCG/2ML IJ SOLN
INTRAMUSCULAR | Status: DC | PRN
  Administered 2020-02-10 (×4): 50 ug via INTRAVENOUS

## 2020-02-10 MED ORDER — FENTANYL CITRATE (PF) 100 MCG/2ML IJ SOLN: 25.0000 ug | INTRAMUSCULAR | Status: DC | PRN

## 2020-02-10 MED ORDER — FENTANYL CITRATE (PF) 100 MCG/2ML IJ SOLN
INTRAMUSCULAR | Status: AC
  Filled 2020-02-10: qty 2

## 2020-02-10 MED ORDER — GLYCOPYRROLATE PF 0.2 MG/ML IJ SOSY
PREFILLED_SYRINGE | INTRAMUSCULAR | Status: AC
  Filled 2020-02-10: qty 1

## 2020-02-10 MED ORDER — OXYCODONE-ACETAMINOPHEN 5-325 MG PO TABS: 1.0000 | ORAL_TABLET | Freq: Four times a day (QID) | ORAL | 0 refills | Status: DC | PRN

## 2020-02-10 MED ORDER — LIDOCAINE 2% (20 MG/ML) 5 ML SYRINGE
INTRAMUSCULAR | Status: DC | PRN
  Administered 2020-02-10: 100 mg via INTRAVENOUS

## 2020-02-10 MED ORDER — ACETAMINOPHEN 500 MG PO TABS
ORAL_TABLET | ORAL | Status: AC
  Filled 2020-02-10: qty 2

## 2020-02-10 MED ORDER — IOHEXOL 300 MG/ML  SOLN
INTRAMUSCULAR | Status: DC | PRN
  Administered 2020-02-10: 17:00:00 30 mL via URETHRAL

## 2020-02-10 MED ORDER — TAMSULOSIN HCL 0.4 MG PO CAPS: 0.4000 mg | ORAL_CAPSULE | Freq: Every day | ORAL | 0 refills | Status: AC

## 2020-02-10 MED ORDER — LIDOCAINE HCL (PF) 2 % IJ SOLN
INTRAMUSCULAR | Status: AC
  Filled 2020-02-10: qty 5

## 2020-02-10 MED ORDER — EPHEDRINE SULFATE-NACL 50-0.9 MG/10ML-% IV SOSY
PREFILLED_SYRINGE | INTRAVENOUS | Status: DC | PRN
  Administered 2020-02-10: 10 mg via INTRAVENOUS

## 2020-02-10 MED ORDER — DEXAMETHASONE SODIUM PHOSPHATE 10 MG/ML IJ SOLN
INTRAMUSCULAR | Status: DC | PRN
  Administered 2020-02-10: 10 mg via INTRAVENOUS

## 2020-02-10 MED ORDER — ONDANSETRON HCL 4 MG/2ML IJ SOLN
INTRAMUSCULAR | Status: AC
  Filled 2020-02-10: qty 2

## 2020-02-10 MED ORDER — ONDANSETRON HCL 4 MG/2ML IJ SOLN
INTRAMUSCULAR | Status: DC | PRN
  Administered 2020-02-10: 4 mg via INTRAVENOUS

## 2020-02-10 MED ORDER — EPHEDRINE 5 MG/ML INJ
INTRAVENOUS | Status: AC
  Filled 2020-02-10: qty 10

## 2020-02-10 MED ORDER — GLYCOPYRROLATE PF 0.2 MG/ML IJ SOSY
PREFILLED_SYRINGE | INTRAMUSCULAR | Status: DC | PRN
  Administered 2020-02-10: .2 mg via INTRAVENOUS

## 2020-02-10 MED ORDER — LACTATED RINGERS IV SOLN: INTRAVENOUS | Status: DC

## 2020-02-10 MED ORDER — AMISULPRIDE (ANTIEMETIC) 5 MG/2ML IV SOLN: 10.0000 mg | Freq: Once | INTRAVENOUS | Status: DC | PRN

## 2020-02-10 MED ORDER — SENNOSIDES-DOCUSATE SODIUM 8.6-50 MG PO TABS: 1.0000 | ORAL_TABLET | Freq: Two times a day (BID) | ORAL | 0 refills | Status: DC

## 2020-02-10 MED ORDER — PROPOFOL 10 MG/ML IV BOLUS
INTRAVENOUS | Status: DC | PRN
  Administered 2020-02-10: 200 mg via INTRAVENOUS

## 2020-02-10 SURGICAL SUPPLY — 28 items
BAG DRAIN URO-CYSTO SKYTR STRL (DRAIN) ×3 IMPLANT
BAG DRN UROCATH (DRAIN) ×1
BASKET LASER NITINOL 1.9FR (BASKET) ×2 IMPLANT
BSKT STON RTRVL 120 1.9FR (BASKET) ×1
CATH INTERMIT  6FR 70CM (CATHETERS) ×2 IMPLANT
CLOTH BEACON ORANGE TIMEOUT ST (SAFETY) ×3 IMPLANT
DRSG TEGADERM 2-3/8X2-3/4 SM (GAUZE/BANDAGES/DRESSINGS) ×4 IMPLANT
FIBER LASER FLEXIVA 365 (UROLOGICAL SUPPLIES) IMPLANT
GLOVE BIO SURGEON STRL SZ7.5 (GLOVE) ×3 IMPLANT
GOWN STRL REUS W/TWL LRG LVL3 (GOWN DISPOSABLE) ×3 IMPLANT
GUIDEWIRE ANG ZIPWIRE 038X150 (WIRE) ×5 IMPLANT
GUIDEWIRE STR DUAL SENSOR (WIRE) ×5 IMPLANT
IV NS 1000ML (IV SOLUTION)
IV NS 1000ML BAXH (IV SOLUTION) ×1 IMPLANT
IV NS IRRIG 3000ML ARTHROMATIC (IV SOLUTION) ×3 IMPLANT
KIT TURNOVER CYSTO (KITS) ×3 IMPLANT
MANIFOLD NEPTUNE II (INSTRUMENTS) ×3 IMPLANT
NS IRRIG 500ML POUR BTL (IV SOLUTION) ×3 IMPLANT
PACK CYSTO (CUSTOM PROCEDURE TRAY) ×3 IMPLANT
SHEATH URETERAL 12FRX35CM (MISCELLANEOUS) ×2 IMPLANT
STENT POLARIS 5FRX26 (STENTS) ×4 IMPLANT
SYR 10ML LL (SYRINGE) ×3 IMPLANT
TRACTIP FLEXIVA PULS ID 200XHI (Laser) IMPLANT
TRACTIP FLEXIVA PULSE ID 200 (Laser) ×3
TUBE CONNECTING 12'X1/4 (SUCTIONS) ×1
TUBE CONNECTING 12X1/4 (SUCTIONS) ×2 IMPLANT
TUBE FEEDING 8FR 16IN STR KANG (MISCELLANEOUS) IMPLANT
TUBING UROLOGY SET (TUBING) ×3 IMPLANT

## 2020-02-10 NOTE — Anesthesia Postprocedure Evaluation (Signed)
Anesthesia Post Note  Patient: Gary Hurst  Procedure(s) Performed: CYSTOSCOPY WITH RETROGRADE PYELOGRAM, URETEROSCOPY AND STENT PLACEMENT (Bilateral Renal) HOLMIUM LASER APPLICATION (Bilateral Renal)     Patient location during evaluation: PACU Anesthesia Type: General Level of consciousness: awake and alert, oriented and patient cooperative Pain management: pain level controlled Vital Signs Assessment: post-procedure vital signs reviewed and stable Respiratory status: spontaneous breathing, nonlabored ventilation and respiratory function stable Cardiovascular status: blood pressure returned to baseline and stable Postop Assessment: no apparent nausea or vomiting Anesthetic complications: no   No complications documented.  Last Vitals:  Vitals:   02/10/20 1730 02/10/20 1745  BP: 120/71 123/72  Pulse: 64 68  Resp: 12 12  Temp:  36.5 C  SpO2: 93% 95%    Last Pain:  Vitals:   02/10/20 1745  TempSrc:   PainSc: 0-No pain                 Pervis Hocking

## 2020-02-10 NOTE — H&P (Signed)
Gary Hurst is an 69 y.o. male.    Chief Complaint: Pre-Op BILATERAL Ureteroscopic Stone Manipulation  HPI:   1 - Recurrent Nephrolithiasis -  Pre 2018 - URS, SWL, MET x several  07/2016 - Left URS to stone free for 84m total UPJ stone  02/2019 - MET Rt distal ureteral stone.   Recent Survellance:  09/2017 - KUB, RUS, BMP - bilateral punctate pap cip calcificiotns w/o hydro. Stable L>R non-complex cysts. GFR normal, no hyperkalemia.  11/2018 - KUB, RUS, BMP - stable pap tip calcs (dominant RLP 649m no hydro. Stable cysts. Cr 0.9, K normal.  12/2019 - KUB, RUS, CMP - RLP calcs 73m21m2, scattered punctate, Stable L>R cysts. BMP normal.   2 - Medical Stone Disease / Renal Leak Hypercalciuria -  Eval 2018 - BMP, PTH, Urate - normal; Composition - 80% CaOx / 20% CaPO4; 24 Hr Urines - very elevated urinary calcium (>440) ==> indapamide    His PCP is Gary Hurst. 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   Today " Gary Hurst seen to proceed with BILATERAL ureteroscopic stone manipulation for progressive recurretn R>L renal stones. UCX negative. C19 screen negative. No interval fevers.     Past Medical History:  Diagnosis Date  . Cancer (HCC)    hx of skin cancer  . Diverticulitis 2017  . History of kidney stones    left  . Hyperlipidemia   . Hypertension   . Renal calculi   . Right ureteral stone     Past Surgical History:  Procedure Laterality Date  . CYSTOSCOPY WITH RETROGRADE PYELOGRAM, URETEROSCOPY AND STENT PLACEMENT Right 09/04/2012   Procedure: CYSTOSCOPY WITH  RIGHT  RETROGRADE PYELOGRAM, DIGITAL URETEROSCOPY AND STENT PLACEMENT;  Surgeon: TheAlexis FrockD;  [facility];  Laterality: Right;  . CYSTOSCOPY/URETEROSCOPY/HOLMIUM LASER/STENT PLACEMENT Left 08/01/2016   Procedure: CYSTOSCOPY/ RETROGRADE/URETEROSCOPY/HOLMIUM LASER STONE BASKETRY//STENT PLACEMENT;  Surgeon: ManAlexis FrockMD;  [facility];  Laterality: Left;  . EXTRACORPOREAL SHOCK WAVE LITHOTRIPSY     X2  . HOLMIUM LASER APPLICATION Right 09/12/73/6433Procedure: HOLMIUM LASER APPLICATION;  Surgeon: TheAlexis FrockD;  [facility];  Laterality: Right;  . HOLMIUM LASER APPLICATION Left 6/62/9/5188Procedure: HOLMIUM LASER APPLICATION;  Surgeon: ManAlexis FrockD;  [facility];  Laterality: Left;  . LAPAROSCOPIC CHOLECYSTECTOMY  09-05-2006  . TONSILLECTOMY  AGE [age] Family History  Problem Relation Age of Onset  . Parkinson's disease Mother   . Hypertension Mother   . Hyperlipidemia Mother   . Kidney Stones Father   . Alzheimer's disease Father   . Kidney Stones Sister   . Kidney Stones Brother    Social History:  reports that he quit smoking about 26 years ago. His smoking use included cigarettes. He quit after 20.00 years of use. He has never used smokeless tobacco. He reports current alcohol use. He reports that he does not use drugs.  Allergies:  Allergies  Allergen Reactions  . Penicillins Rash    Has patient had a PCN reaction causing immediate rash, facial/tongue/throat swelling, SOB or lightheadedness with hypotension: no Has patient had a PCN reaction causing severe rash involving mucus membranes or skin necrosis: yes Has patient had a PCN reaction occurring within the last 10 years: no If all of the  above answers are "NO", then may proceed with Cephalosporin use.     Medications Prior to Admission  Medication Sig Dispense Refill  . aspirin EC 81 MG tablet Take 81 mg by mouth daily.    Marland Kitchen atorvastatin (LIPITOR) 40 MG tablet Take 40 mg by mouth every evening.     Marland Kitchen doxycycline (ADOXA) 100 MG tablet Take 100 mg by mouth daily. X 21 days started 01-31-2020    . indapamide (LOZOL) 2.5 MG tablet Take 2.5 mg by mouth daily.    . ramipril (ALTACE) 10 MG capsule  Take 10 mg by mouth every morning.       No results found for this or any previous visit (from the past 48 hour(s)). No results found.  Review of Systems  Constitutional: Negative for chills and fever.  All other systems reviewed and are negative.   Height 5' 9" (1.753 m), weight 89.8 kg. Physical Exam Constitutional:      Comments: Very pleasant. At baseline.   HENT:     Head: Normocephalic.     Nose: Nose normal.  Eyes:     Pupils: Pupils are equal, round, and reactive to light.  Cardiovascular:     Pulses: Normal pulses.  Pulmonary:     Effort: Pulmonary effort is normal.  Abdominal:     General: Abdomen is flat.  Genitourinary:    Comments: NO CVAT at present.  Musculoskeletal:        General: Normal range of motion.     Cervical back: Normal range of motion.  Skin:    General: Skin is warm.  Neurological:     General: No focal deficit present.     Mental Status: He is alert.  Psychiatric:        Mood and Affect: Mood normal.      Assessment/Plan  Proceed as planned with BILATERAL ureteroscopic stone manipulation. Risks, benefits, alternatives, expected peri-op course discussed previously and reiterated today.   Alexis Frock, MD 02/10/2020, 1:31 PM

## 2020-02-10 NOTE — Anesthesia Procedure Notes (Signed)
Procedure Name: LMA Insertion Date/Time: 02/10/2020 3:24 PM Performed by: Rogers Blocker, CRNA Pre-anesthesia Checklist: Patient identified, Emergency Drugs available, Suction available and Patient being monitored Patient Re-evaluated:Patient Re-evaluated prior to induction Oxygen Delivery Method: Circle System Utilized Preoxygenation: Pre-oxygenation with 100% oxygen Induction Type: IV induction Ventilation: Mask ventilation without difficulty LMA: LMA inserted LMA Size: 5.0 Number of attempts: 1 Placement Confirmation: positive ETCO2 Tube secured with: Tape Dental Injury: Teeth and Oropharynx as per pre-operative assessment

## 2020-02-10 NOTE — Discharge Instructions (Signed)
1 - You may have urinary urgency (bladder spasms) and bloody urine on / off with stent in place. This is normal.  2 - Remove tethered stents on Monday morning at home by pulling on string, then blue-white plastic tubing, and discarding. Office is open Monday if any problems arise. There are TWO stents tethered together.   3 - Call MD or go to ER for fever >102, severe pain / nausea / vomiting not relieved by medications, or acute change in medical status     Ureteral Stent Implantation, Care After This sheet gives you information about how to care for yourself after your procedure. Your health care provider may also give you more specific instructions. If you have problems or questions, contact your health care provider. What can I expect after the procedure? After the procedure, it is common to have:  Nausea.  Mild pain when you urinate. You may feel this pain in your lower back or lower abdomen. The pain should stop within a few minutes after you urinate. This may last for up to 1 week.  A small amount of blood in your urine for several days. Follow these instructions at home: Medicines  Take over-the-counter and prescription medicines only as told by your health care provider.  If you were prescribed an antibiotic medicine, take it as told by your health care provider. Do not stop taking the antibiotic even if you start to feel better.  Do not drive for 24 hours if you were given a sedative during your procedure.  Ask your health care provider if the medicine prescribed to you requires you to avoid driving or using heavy machinery. Activity  Rest as told by your health care provider.  Avoid sitting for a long time without moving. Get up to take short walks every 1-2 hours. This is important to improve blood flow and breathing. Ask for help if you feel weak or unsteady.  Return to your normal activities as told by your health care provider. Ask your health care provider what  activities are safe for you. General instructions   Watch for any blood in your urine. Call your health care provider if the amount of blood in your urine increases.  If you have a catheter: ? Follow instructions from your health care provider about taking care of your catheter and collection bag. ? Do not take baths, swim, or use a hot tub until your health care provider approves. Ask your health care provider if you may take showers. You may only be allowed to take sponge baths.  Drink enough fluid to keep your urine pale yellow.  Do not use any products that contain nicotine or tobacco, such as cigarettes, e-cigarettes, and chewing tobacco. These can delay healing after surgery. If you need help quitting, ask your health care provider.  Keep all follow-up visits as told by your health care provider. This is important. Contact a health care provider if:  You have pain that gets worse or does not get better with medicine, especially pain when you urinate.  You have difficulty urinating.  You feel nauseous or you vomit repeatedly during a period of more than 2 days after the procedure. Get help right away if:  Your urine is dark red or has blood clots in it.  You are leaking urine (have incontinence).  The end of the stent comes out of your urethra.  You cannot urinate.  You have sudden, sharp, or severe pain in your abdomen or lower back.  You have a fever.  You have swelling or pain in your legs.  You have difficulty breathing. Summary  After the procedure, it is common to have mild pain when you urinate that goes away within a few minutes after you urinate. This may last for up to 1 week.  Watch for any blood in your urine. Call your health care provider if the amount of blood in your urine increases.  Take over-the-counter and prescription medicines only as told by your health care provider.  Drink enough fluid to keep your urine pale yellow. This information is  not intended to replace advice given to you by your health care provider. Make sure you discuss any questions you have with your health care provider. Document Revised: 11/19/2017 Document Reviewed: 11/20/2017 Elsevier Patient Education  [place] Instructions  Activity: Get plenty of rest for the remainder of the day. A responsible individual must stay with you for 24 hours following the procedure.  For the next 24 hours, DO NOT: -Drive a car -Paediatric nurse -Drink alcoholic beverages -Take any medication unless instructed by your physician -Make any legal decisions or sign important papers.  Meals: Start with liquid foods such as gelatin or soup. Progress to regular foods as tolerated. Avoid greasy, spicy, heavy foods. If nausea and/or vomiting occur, drink only clear liquids until the nausea and/or vomiting subsides. Call your physician if vomiting continues.  Special Instructions/Symptoms: Your throat may feel dry or sore from the anesthesia or the breathing tube placed in your throat during surgery. If this causes discomfort, gargle with warm salt water. The discomfort should disappear within 24 hours.

## 2020-02-10 NOTE — Brief Op Note (Signed)
02/10/2020  4:42 PM  PATIENT:  Gary Hurst  70 y.o. male  PRE-OPERATIVE DIAGNOSIS:   RIGHT AND LEFT RENAL STONES  POST-OPERATIVE DIAGNOSIS:  RIGHT AND LEFT RENAL STONES  PROCEDURE:  Procedure(s) with comments: CYSTOSCOPY WITH RETROGRADE PYELOGRAM, URETEROSCOPY AND STENT PLACEMENT (Bilateral) - 90 MINS HOLMIUM LASER APPLICATION (Bilateral)  SURGEON:  Surgeon(s) and Role:    Alexis Frock, MD - Primary  PHYSICIAN ASSISTANT:   ASSISTANTS: none   ANESTHESIA:   general  EBL:  5 mL   BLOOD ADMINISTERED:none  DRAINS: none   LOCAL MEDICATIONS USED:  NONE  SPECIMEN:  Source of Specimen:  bilateral renal stone fragments  DISPOSITION OF SPECIMEN:  Given to patient  COUNTS:  YES  TOURNIQUET:  * No tourniquets in log *  DICTATION: .Other Dictation: Dictation Number [id number]  PLAN OF CARE: Discharge to home after PACU  PATIENT DISPOSITION:  PACU - hemodynamically stable.   Delay start of Pharmacological VTE agent (>24hrs) due to surgical blood loss or risk of bleeding: yes

## 2020-02-10 NOTE — Transfer of Care (Signed)
Immediate Anesthesia Transfer of Care Note  Patient: Gary Hurst  Procedure(s) Performed: CYSTOSCOPY WITH RETROGRADE PYELOGRAM, URETEROSCOPY AND STENT PLACEMENT (Bilateral Renal) HOLMIUM LASER APPLICATION (Bilateral Renal)  Patient Location: PACU  Anesthesia Type:General  Level of Consciousness: awake, alert , oriented and patient cooperative  Airway & Oxygen Therapy: Patient Spontanous Breathing  Post-op Assessment: Report given to RN and Post -op Vital signs reviewed and stable  Post vital signs: Reviewed and stable  Last Vitals:  Vitals Value Taken Time  BP 130/75 02/10/20 1649  Temp    Pulse 73 02/10/20 1653  Resp 10 02/10/20 1653  SpO2 93 % 02/10/20 1653  Vitals shown include unvalidated device data.  Last Pain:  Vitals:   02/10/20 1331  TempSrc: Oral  PainSc: 3       Patients Stated Pain Goal: 5 (01/49/96 9249)  Complications: No complications documented.

## 2020-02-10 NOTE — Anesthesia Preprocedure Evaluation (Signed)
Anesthesia Evaluation  Patient identified by MRN, date of birth, ID band Patient awake    Reviewed: Allergy & Precautions, NPO status , Patient's Chart, lab work & pertinent test results  Airway Mallampati: II  TM Distance: >3 FB Neck ROM: Full    Dental  (+) Dental Advisory Given   Pulmonary former smoker,    breath sounds clear to auscultation       Cardiovascular hypertension, Pt. on medications  Rhythm:Regular Rate:Normal     Neuro/Psych negative neurological ROS     GI/Hepatic negative GI ROS, Neg liver ROS,   Endo/Other  negative endocrine ROS  Renal/GU Renal disease     Musculoskeletal   Abdominal   Peds  Hematology negative hematology ROS (+)   Anesthesia Other Findings   Reproductive/Obstetrics                             Anesthesia Physical Anesthesia Plan  ASA: II  Anesthesia Plan: General   Post-op Pain Management:    Induction: Intravenous  PONV Risk Score and Plan: 2 and Dexamethasone, Ondansetron and Treatment may vary due to age or medical condition  Airway Management Planned: LMA  Additional Equipment: None  Intra-op Plan:   Post-operative Plan: Extubation in OR  Informed Consent: I have reviewed the patients History and Physical, chart, labs and discussed the procedure including the risks, benefits and alternatives for the proposed anesthesia with the patient or authorized representative who has indicated his/her understanding and acceptance.     Dental advisory given  Plan Discussed with: CRNA  Anesthesia Plan Comments:         Anesthesia Quick Evaluation

## 2020-02-11 ENCOUNTER — Encounter (HOSPITAL_BASED_OUTPATIENT_CLINIC_OR_DEPARTMENT_OTHER): Payer: Self-pay | Admitting: Urology

## 2020-02-12 NOTE — Op Note (Signed)
NAME: Gary Hurst, Gary Hurst MEDICAL RECORD ZC:[id number] ACCOUNT [id number] DATE OF BIRTH:1950-09-16 FACILITY: [facility] LOCATION: [facility] PHYSICIAN:Charlize Hathaway, MD  OPERATIVE REPORT  DATE OF PROCEDURE:  02/10/2020  PREOPERATIVE DIAGNOSES:  Right greater than left recurrent renal stones, history of hypercalciuria.  PROCEDURE: 1.  Cystoscopy, bilateral retrograde pyelograms, interpretation. 2.  Bilateral ureteroscopy with laser lithotripsy. 3.  Insertion of bilateral ureteral stents, 5 x 26 Polaris with tether.  ESTIMATED BLOOD LOSS:  Nil.  COMPLICATIONS:  None.  SPECIMEN:  Bilateral renal stone fragments given to the patient.  FINDINGS: 1.  Multiple papillary tip calcifications on the left side.  Total stone volume approximately 8 mm. 2.  Multifocal right renal stones, largest lower pole.  Total volume approximately 1.2 cm. 3.  Complete resolution of all accessible stone fragments larger than one-third mm following laser loosening dissection bilaterally. 4.  Placement of bilateral ureteral stents, proximal end in the renal pelvis, distal end in urinary bladder.  INDICATIONS:  The patient is a very pleasant and quite vigorous 69 year old man with longstanding history of recurrent urolithiasis and renal leak, hypercalciuria.  He is compliant with medical therapy, and surveillance.  He was found on surveillance  imaging to have a slow but consistent recurrence of stones, right greater than left over the past 3 years, now on the right side total stone volume encroaching upon 1.5 cm.  No hydronephrosis noted.  Options were discussed for management including  continued surveillance only versus a path to stone free with ureteroscopy with goal to prevent progression to major surgery, first on the right side where stone volume was becoming quite large and he wished to proceed.  Informed consent was obtained and  placed in medical record.  DESCRIPTION OF PROCEDURE:  The patient being himself,  procedure being bilateral ureteroscopic stimulation was confirmed.  Procedure timeout was performed.  Intravenous antibiotics administered, general LMA anesthesia induced.  The patient was placed into  a low lithotomy position.  Sterile field was created, prepped and draping the patient's penis, perineum and proximal thighs using iodine.  Cystourethroscopy was performed using 21-French rigid cystoscope with offset lens.  Inspection of anterior and  posterior urethra was unremarkable.  Inspection of bladder revealed no diverticula, calcifications, papillary lesions.  There was mild trabeculation noted.  There was a moderate median lobe of the prostate noted.  Ureteral orifices were somewhat  laterally displaced.  The left ureteral orifice was cannulated with a 6-French renal catheter and left retrograde pyelogram was obtained.  Left retrograde pyelogram demonstrated a single left ureter, single system left kidney.  No obvious filling defects or narrowing noted.  A 0.03 ZIPwire was advanced to lower pole and set aside as a safety wire.  Next, a right retrograde pyelogram was  obtained.  Right retrograde pyelogram demonstrated a single right ureter, single system right kidney.  There was a filling defect in the lower pole consistent with known stone.  A 0.03 ZIPwire was advanced to lower pole and set aside as a safety wire.  An 8-French  feeding tube was placed in the urinary bladder for pressure release, and semirigid ureteroscopy performed of the distal orifice of the right ureter alongside a separate sensor working wire.  No mucosal abnormalities were found.  Next, semirigid  ureteroscopy was performed of the distal orifice of the left ureter alongside a separate sensor working wire.  No mucosal abnormalities were found.  The semirigid scope was exchanged for a 12/14 medium length ureteral access sheath to the level of the  proximal  ureter using continuous fluoroscopic guidance and flexible digital  ureteroscopy was performed of the proximal left ureter and systematic inspection of left kidney, including all calices x3.  There was multifocal papillary tip calcifications, the  vast majority of which were amenable to simple basketing.  They were grasped with Escape basket, removed and set aside.  Dominant fragment in upper pole required holmium laser lithotripsy, settings of 0.2 joules and 20 Hz.  Approximately 50% of the  stone was dusted, 50% fragmented.  The fragments then being amenable to basketing.  Following this, complete resolution of all accessible stone fragments larger than one-third mm on the left side, access sheath was removed under continuous vision.  No  significant mucosal abnormalities were found.  Next, the access sheath was placed over the right sensor working wire to the level of the proximal right ureter using continuous fluoroscopic guidance and flexible digital ureteroscopy was performed of the  proximal right ureter and systematic inspection of the right kidney, including all calices x3.  There was much larger volume multifocal calcifications as anticipated, a 2 dominant foci, one upper mid pole and one lower pole that were much too large for  simple basketing.  The fragments that were smaller were grasped with the Escape basket, removed and set aside to be given to the patient.  The 2 dominant fragments were retrograde positioned using the Escape basket into an upper pole calix to allow for  less acute angulation and holmium laser energy applied to stone using setting of 0.2 joules and 20 Hz, and approximately 60% of stone volume was dusted and 40% fragmented.  The remaining fragments were grasped with the Escape basket, removed and set  aside to be given to the patient.  Following this, complete resolution of all accessible stone fragments larger than one-third mm on the right side.  Excellent hemostasis.  No evidence of any sort of renal perforation.  Access sheath was removed  under  continuous vision.  No significant mucosal abnormalities were found.  Given the bilateral nature of the procedure and large volume stone, it was felt that interval stenting with tethered stent would be warranted.  As such, a new 5 x 26 Polaris-type stent  was placed over the right safety wire using fluoroscopic guidance.  Good proximal and distal planes were noted.  Similarly, a separate 5 x 26 Polaris-type stent was placed over the left safety wire using fluoroscopic guidance.  Good proximal and distal  planes were noted.  Tether was left in place and fashioned to the dorsum penis.  The procedure was terminated.  The patient tolerated the procedure well.  No immediate perioperative complications.  The patient taken to postanesthesia care in stable  condition.  Plan for discharge home.  HN/NUANCE  D:02/10/2020 T:02/11/2020 JOB:[id number]

## 2020-03-14 ENCOUNTER — Ambulatory Visit: Payer: Self-pay | Admitting: Dermatology

## 2020-06-15 ENCOUNTER — Other Ambulatory Visit: Payer: Self-pay

## 2020-06-15 ENCOUNTER — Ambulatory Visit (INDEPENDENT_AMBULATORY_CARE_PROVIDER_SITE_OTHER): Payer: Medicare Other | Admitting: Dermatology

## 2020-06-15 ENCOUNTER — Encounter: Payer: Self-pay | Admitting: Dermatology

## 2020-06-15 DIAGNOSIS — Z85828 Personal history of other malignant neoplasm of skin: Secondary | ICD-10-CM

## 2020-06-15 DIAGNOSIS — Z1283 Encounter for screening for malignant neoplasm of skin: Secondary | ICD-10-CM

## 2020-06-15 DIAGNOSIS — D485 Neoplasm of uncertain behavior of skin: Secondary | ICD-10-CM

## 2020-06-15 DIAGNOSIS — L570 Actinic keratosis: Secondary | ICD-10-CM | POA: Diagnosis not present

## 2020-06-15 DIAGNOSIS — C44519 Basal cell carcinoma of skin of other part of trunk: Secondary | ICD-10-CM | POA: Diagnosis not present

## 2020-06-15 NOTE — Patient Instructions (Signed)

## 2020-06-25 ENCOUNTER — Encounter: Payer: Self-pay | Admitting: Dermatology

## 2020-09-19 ENCOUNTER — Ambulatory Visit: Payer: Self-pay | Admitting: Orthopedic Surgery

## 2020-09-22 ENCOUNTER — Ambulatory Visit: Payer: Self-pay | Admitting: Orthopedic Surgery

## 2020-09-22 NOTE — H&P (Signed)
Gary Hurst is an 70 y.o. male.   Chief Complaint: back and leg pain HPI: Reported by patient. Reason for Visit: (normal) review of test results (lumbar MRI) Location (Lower Extremity): lower back pain ; leg pain on the left, , Severity: pain level 5/10 Aggravating Factors: sitting for Associated Symptoms: numbness/tingling; weakness (LLE) Medications: Methocarbamol prn  Past Medical History:  Diagnosis Date   Cancer ([place])    hx of skin cancer   Diverticulitis 2017   History of kidney stones    left   Hyperlipidemia    Hypertension    Renal calculi    Right ureteral stone     Past Surgical History:  Procedure Laterality Date   CYSTOSCOPY WITH RETROGRADE PYELOGRAM, URETEROSCOPY AND STENT PLACEMENT Right 09/04/2012   Procedure: CYSTOSCOPY WITH  RIGHT  RETROGRADE PYELOGRAM, DIGITAL URETEROSCOPY AND STENT PLACEMENT;  Surgeon: Alexis Frock, MD;  Location: [facility];  Service: Urology;  Laterality: Right;   CYSTOSCOPY WITH RETROGRADE PYELOGRAM, URETEROSCOPY AND STENT PLACEMENT Bilateral 02/10/2020   Procedure: CYSTOSCOPY WITH RETROGRADE PYELOGRAM, URETEROSCOPY AND STENT PLACEMENT;  Surgeon: Alexis Frock, MD;  Location: [facility];  Service: Urology;  Laterality: Bilateral;  90 MINS   CYSTOSCOPY/URETEROSCOPY/HOLMIUM LASER/STENT PLACEMENT Left 08/01/2016   Procedure: CYSTOSCOPY/ RETROGRADE/URETEROSCOPY/HOLMIUM LASER STONE BASKETRY//STENT PLACEMENT;  Surgeon: Alexis Frock, MD;  Location: [facility];  Service: Urology;  Laterality: Left;   EXTRACORPOREAL SHOCK WAVE LITHOTRIPSY     X2   HOLMIUM LASER APPLICATION Right [id number]   Procedure: HOLMIUM LASER APPLICATION;  Surgeon: Alexis Frock, MD;  Location: [facility];  Service: Urology;  Laterality: Right;   HOLMIUM LASER APPLICATION Left [id number]   Procedure: HOLMIUM LASER APPLICATION;  Surgeon: Alexis Frock, MD;  Location: [facility];  Service:  Urology;  Laterality: Left;   HOLMIUM LASER APPLICATION Bilateral [id number]   Procedure: HOLMIUM LASER APPLICATION;  Surgeon: Alexis Frock, MD;  Location: [facility];  Service: Urology;  Laterality: Bilateral;   LAPAROSCOPIC CHOLECYSTECTOMY  09-05-2006   TONSILLECTOMY  AGE 89    Family History  Problem Relation Age of Onset   Parkinson's disease Mother    Hypertension Mother    Hyperlipidemia Mother    Kidney Stones Father    Alzheimer's disease Father    Kidney Stones Sister    Kidney Stones Brother    Social History:  reports that he quit smoking about 27 years ago. His smoking use included cigarettes. He has never used smokeless tobacco. He reports current alcohol use. He reports that he does not use drugs.  Allergies:  Allergies  Allergen Reactions   Penicillins Rash    Has patient had a PCN reaction causing immediate rash, facial/tongue/throat swelling, SOB or lightheadedness with hypotension: no Has patient had a PCN reaction causing severe rash involving mucus membranes or skin necrosis: yes Has patient had a PCN reaction occurring within the last 10 years: no If all of the above answers are "NO", then may proceed with Cephalosporin use.    Medications: atorvastatin 40 mg tablet doxycycline hyclate 100 mg tablet indapamide 2.5 mg tablet ketorolac 10 mg tablet methocarbamoL 500 mg tablet oxyCODONE-acetaminophen 5 mg-325 mg tablet ramipriL 10 mg capsule Senna-S 8.6 mg-50 mg tablet tamsulosin 0.4 mg capsule  Review of Systems  Constitutional: Negative.   HENT: Negative.    Eyes: Negative.   Respiratory: Negative.    Cardiovascular: Negative.   Genitourinary: Negative.   Musculoskeletal:  Positive for back pain and myalgias.  Neurological:  Positive for weakness and numbness.   There were no vitals taken for this visit. Physical Exam Constitutional:      Appearance: Normal appearance.  HENT:     Head: Normocephalic.     Right Ear:  External ear normal.     Left Ear: External ear normal.     Nose: Nose normal.     Mouth/Throat:     Pharynx: Oropharynx is clear.  Eyes:     Conjunctiva/sclera: Conjunctivae normal.  Cardiovascular:     Rate and Rhythm: Normal rate and regular rhythm.     Pulses: Normal pulses.  Pulmonary:     Effort: Pulmonary effort is normal.     Breath sounds: Normal breath sounds.  Abdominal:     General: Bowel sounds are normal.  Musculoskeletal:     Cervical back: Normal range of motion.     Comments: Gait and Station: Appearance: ambulating with no assistive devices and antalgic gait.  Constitutional: General Appearance: healthy-appearing and distress (mild).  Psychiatric: Mood and Affect: active and alert.  Cardiovascular System: Edema Right: none; Dorsalis and posterior tibial pulses 2+. Edema Left: none.  Abdomen: Inspection and Palpation: non-distended and no tenderness.  Skin: Inspection and palpation: no rash.  Lumbar Spine: Inspection: normal alignment. Bony Palpation of the Lumbar Spine: tender at lumbosacral junction.. Bony Palpation of the Right Hip: no tenderness of the greater trochanter and tenderness of the SI joint; Pelvis stable. Bony Palpation of the Left Hip: no tenderness of the greater trochanter and tenderness of the SI joint. Soft Tissue Palpation on the Right: No flank pain with percussion. Active Range of Motion: limited flexion and extention.  Motor Strength: L1 Motor Strength on the Right: hip flexion iliopsoas 5/5. L1 Motor Strength on the Left: hip flexion iliopsoas 5/5. L2-L4 Motor Strength on the Right: knee extension quadriceps 5/5. L2-L4 Motor Strength on the Left: knee extension quadriceps 5/5. L5 Motor Strength on the Right: ankle dorsiflexion tibialis anterior 5/5 and great toe extension extensor hallucis longus 5/5. L5 Motor Strength on the Left: ankle dorsiflexion tibialis anterior 5/5 and great toe extension extensor hallucis longus 5/5. S1 Motor Strength  on the Right: plantar flexion gastrocnemius 5/5. S1 Motor Strength on the Left: plantar flexion gastrocnemius 5/5.  Neurological System: Knee Reflex Right: normal (2). Knee Reflex Left: normal (2). Ankle Reflex Right: normal (2). Ankle Reflex Left: normal (2). Babinski Reflex Right: plantar reflex absent. Babinski Reflex Left: plantar reflex absent. Sensation on the Right: normal distal extremities. Sensation on the Left: normal distal extremities. Special Tests on the Right: no clonus of the ankle/knee and seated straight leg raising test positive. Special Tests on the Left: no clonus of the ankle/knee and seated straight leg raising test positive.  Neurological:     Mental Status: He is alert.    MRI demonstrates multifactorial spinal stenosis L3-4 with congenitally short pedicles. Severe lateral recess stenosis L3-4 left. Foraminal stenosis. Some retrolisthesis. Has lateral recess stenosis at L4-5 left. The sacralized vertebral body in the rudimentary disc is L5-S1. The severity of the stenosis is to degenerated disks from the bottom. A labeling transition. There is an extrusion at L3-4. There is incidental cyst noted in the kidney and in the liver.  Assessment/Plan Impression:  Refractory lower extremity radicular pain secondary multifactorial severe spinal stenosis at L3-4 with disc protrusion L3-4 left. Lateral recess stenosis L4-5 left. Rudimentary disc L5-S1  Incidental cyst in the liver and the kidney.  Plan:  We discussed options including epidural  steroid injections pain management activity modification he reports injections have not helped him in the past and he is not interested in epidural injections.  Conservative treatment has not been helpful. His 1 year of duration is radiating down into the left leg his leg pain is much worse than his back pain.  We discussed lumbar decompression which here according to the MRI labeling would be at L3-4 centrally and L4-5 left. He would like  to proceed with that  I had an extensive discussion with the patient concerning the pathology relevant anatomy and treatment options. At this point exhausting conservative treatment and in the presence of a neurologic deficit we discussed microlumbar decompression. I discussed the risks and benefits including bleeding, infection, DVT, PE, anesthetic complications, worsening in their symptoms, improvement in their symptoms, C SF leakage, epidural fibrosis, need for future surgeries such as revision discectomy and lumbar fusion. I also indicated that this is an operation to basically decompress the nerve root to allow recovery as opposed to fixing a herniated disc and that the incidence of recurrent chest disc herniation can approach 15%. Also that nerve root recovery is variable and may not recover completely.  I discussed the operative course including overnight in the hospital. Immediate ambulation. Follow-up in 2 weeks for suture removal. 6 weeks until healing of the herniation followed by 6 weeks of reconditioning and strengthening of the core musculature. Also discussed the need to employ the concepts of disc pressure management and core motion following the surgery to minimize the risk of recurrent disc herniation. We will obtain preoperative clearance i if necessary and proceed accordingly.  He realizes that this for back pain not for the leg pain. No history of MRSA. No history of DVT.  Refer him to his urologist who he is seen in the past Dr. Bess Harvest for kidney stones. And probably has serial evaluation of these renal cyst. The him a copy of his MRI report.  Refer him to his surgeon that removed his gallbladder. A copy of his CD and report for his liver cyst to determine whether is any further action need to be taken.  Preoperative clearance.  Plan microlumbar decompression L3-4, L4-5  Cecilie Kicks, PA-C for Dr Tonita Cong 09/22/2020, 1:16 PM

## 2020-09-22 NOTE — H&P (View-Only) (Signed)
Gary Hurst is an 70 y.o. male.   Chief Complaint: back and leg pain HPI: Reported by patient. Reason for Visit: (normal) review of test results (lumbar MRI) Location (Lower Extremity): lower back pain ; leg pain on the left, , Severity: pain level 5/10 Aggravating Factors: sitting for Associated Symptoms: numbness/tingling; weakness (LLE) Medications: Methocarbamol prn  Past Medical History:  Diagnosis Date   Cancer ([place])    hx of skin cancer   Diverticulitis 2017   History of kidney stones    left   Hyperlipidemia    Hypertension    Renal calculi    Right ureteral stone     Past Surgical History:  Procedure Laterality Date   CYSTOSCOPY WITH RETROGRADE PYELOGRAM, URETEROSCOPY AND STENT PLACEMENT Right 09/04/2012   Procedure: CYSTOSCOPY WITH  RIGHT  RETROGRADE PYELOGRAM, DIGITAL URETEROSCOPY AND STENT PLACEMENT;  Surgeon: Alexis Frock, MD;  Location: [facility];  Service: Urology;  Laterality: Right;   CYSTOSCOPY WITH RETROGRADE PYELOGRAM, URETEROSCOPY AND STENT PLACEMENT Bilateral 02/10/2020   Procedure: CYSTOSCOPY WITH RETROGRADE PYELOGRAM, URETEROSCOPY AND STENT PLACEMENT;  Surgeon: Alexis Frock, MD;  Location: [facility];  Service: Urology;  Laterality: Bilateral;  90 MINS   CYSTOSCOPY/URETEROSCOPY/HOLMIUM LASER/STENT PLACEMENT Left 08/01/2016   Procedure: CYSTOSCOPY/ RETROGRADE/URETEROSCOPY/HOLMIUM LASER STONE BASKETRY//STENT PLACEMENT;  Surgeon: Alexis Frock, MD;  Location: [facility];  Service: Urology;  Laterality: Left;   EXTRACORPOREAL SHOCK WAVE LITHOTRIPSY     X2   HOLMIUM LASER APPLICATION Right [id number]   Procedure: HOLMIUM LASER APPLICATION;  Surgeon: Alexis Frock, MD;  Location: [facility];  Service: Urology;  Laterality: Right;   HOLMIUM LASER APPLICATION Left [id number]   Procedure: HOLMIUM LASER APPLICATION;  Surgeon: Alexis Frock, MD;  Location: [facility];  Service:  Urology;  Laterality: Left;   HOLMIUM LASER APPLICATION Bilateral [id number]   Procedure: HOLMIUM LASER APPLICATION;  Surgeon: Alexis Frock, MD;  Location: [facility];  Service: Urology;  Laterality: Bilateral;   LAPAROSCOPIC CHOLECYSTECTOMY  09-05-2006   TONSILLECTOMY  AGE 54    Family History  Problem Relation Age of Onset   Parkinson's disease Mother    Hypertension Mother    Hyperlipidemia Mother    Kidney Stones Father    Alzheimer's disease Father    Kidney Stones Sister    Kidney Stones Brother    Social History:  reports that he quit smoking about 27 years ago. His smoking use included cigarettes. He has never used smokeless tobacco. He reports current alcohol use. He reports that he does not use drugs.  Allergies:  Allergies  Allergen Reactions   Penicillins Rash    Has patient had a PCN reaction causing immediate rash, facial/tongue/throat swelling, SOB or lightheadedness with hypotension: no Has patient had a PCN reaction causing severe rash involving mucus membranes or skin necrosis: yes Has patient had a PCN reaction occurring within the last 10 years: no If all of the above answers are "NO", then may proceed with Cephalosporin use.    Medications: atorvastatin 40 mg tablet doxycycline hyclate 100 mg tablet indapamide 2.5 mg tablet ketorolac 10 mg tablet methocarbamoL 500 mg tablet oxyCODONE-acetaminophen 5 mg-325 mg tablet ramipriL 10 mg capsule Senna-S 8.6 mg-50 mg tablet tamsulosin 0.4 mg capsule  Review of Systems  Constitutional: Negative.   HENT: Negative.    Eyes: Negative.   Respiratory: Negative.    Cardiovascular: Negative.   Genitourinary: Negative.   Musculoskeletal:  Positive for back pain and myalgias.  Neurological:  Positive for weakness and numbness.   There were no vitals taken for this visit. Physical Exam Constitutional:      Appearance: Normal appearance.  HENT:     Head: Normocephalic.     Right Ear:  External ear normal.     Left Ear: External ear normal.     Nose: Nose normal.     Mouth/Throat:     Pharynx: Oropharynx is clear.  Eyes:     Conjunctiva/sclera: Conjunctivae normal.  Cardiovascular:     Rate and Rhythm: Normal rate and regular rhythm.     Pulses: Normal pulses.  Pulmonary:     Effort: Pulmonary effort is normal.     Breath sounds: Normal breath sounds.  Abdominal:     General: Bowel sounds are normal.  Musculoskeletal:     Cervical back: Normal range of motion.     Comments: Gait and Station: Appearance: ambulating with no assistive devices and antalgic gait.  Constitutional: General Appearance: healthy-appearing and distress (mild).  Psychiatric: Mood and Affect: active and alert.  Cardiovascular System: Edema Right: none; Dorsalis and posterior tibial pulses 2+. Edema Left: none.  Abdomen: Inspection and Palpation: non-distended and no tenderness.  Skin: Inspection and palpation: no rash.  Lumbar Spine: Inspection: normal alignment. Bony Palpation of the Lumbar Spine: tender at lumbosacral junction.. Bony Palpation of the Right Hip: no tenderness of the greater trochanter and tenderness of the SI joint; Pelvis stable. Bony Palpation of the Left Hip: no tenderness of the greater trochanter and tenderness of the SI joint. Soft Tissue Palpation on the Right: No flank pain with percussion. Active Range of Motion: limited flexion and extention.  Motor Strength: L1 Motor Strength on the Right: hip flexion iliopsoas 5/5. L1 Motor Strength on the Left: hip flexion iliopsoas 5/5. L2-L4 Motor Strength on the Right: knee extension quadriceps 5/5. L2-L4 Motor Strength on the Left: knee extension quadriceps 5/5. L5 Motor Strength on the Right: ankle dorsiflexion tibialis anterior 5/5 and great toe extension extensor hallucis longus 5/5. L5 Motor Strength on the Left: ankle dorsiflexion tibialis anterior 5/5 and great toe extension extensor hallucis longus 5/5. S1 Motor Strength  on the Right: plantar flexion gastrocnemius 5/5. S1 Motor Strength on the Left: plantar flexion gastrocnemius 5/5.  Neurological System: Knee Reflex Right: normal (2). Knee Reflex Left: normal (2). Ankle Reflex Right: normal (2). Ankle Reflex Left: normal (2). Babinski Reflex Right: plantar reflex absent. Babinski Reflex Left: plantar reflex absent. Sensation on the Right: normal distal extremities. Sensation on the Left: normal distal extremities. Special Tests on the Right: no clonus of the ankle/knee and seated straight leg raising test positive. Special Tests on the Left: no clonus of the ankle/knee and seated straight leg raising test positive.  Neurological:     Mental Status: He is alert.    MRI demonstrates multifactorial spinal stenosis L3-4 with congenitally short pedicles. Severe lateral recess stenosis L3-4 left. Foraminal stenosis. Some retrolisthesis. Has lateral recess stenosis at L4-5 left. The sacralized vertebral body in the rudimentary disc is L5-S1. The severity of the stenosis is to degenerated disks from the bottom. A labeling transition. There is an extrusion at L3-4. There is incidental cyst noted in the kidney and in the liver.  Assessment/Plan Impression:  Refractory lower extremity radicular pain secondary multifactorial severe spinal stenosis at L3-4 with disc protrusion L3-4 left. Lateral recess stenosis L4-5 left. Rudimentary disc L5-S1  Incidental cyst in the liver and the kidney.  Plan:  We discussed options including epidural  steroid injections pain management activity modification he reports injections have not helped him in the past and he is not interested in epidural injections.  Conservative treatment has not been helpful. His 1 year of duration is radiating down into the left leg his leg pain is much worse than his back pain.  We discussed lumbar decompression which here according to the MRI labeling would be at L3-4 centrally and L4-5 left. He would like  to proceed with that  I had an extensive discussion with the patient concerning the pathology relevant anatomy and treatment options. At this point exhausting conservative treatment and in the presence of a neurologic deficit we discussed microlumbar decompression. I discussed the risks and benefits including bleeding, infection, DVT, PE, anesthetic complications, worsening in their symptoms, improvement in their symptoms, C SF leakage, epidural fibrosis, need for future surgeries such as revision discectomy and lumbar fusion. I also indicated that this is an operation to basically decompress the nerve root to allow recovery as opposed to fixing a herniated disc and that the incidence of recurrent chest disc herniation can approach 15%. Also that nerve root recovery is variable and may not recover completely.  I discussed the operative course including overnight in the hospital. Immediate ambulation. Follow-up in 2 weeks for suture removal. 6 weeks until healing of the herniation followed by 6 weeks of reconditioning and strengthening of the core musculature. Also discussed the need to employ the concepts of disc pressure management and core motion following the surgery to minimize the risk of recurrent disc herniation. We will obtain preoperative clearance i if necessary and proceed accordingly.  He realizes that this for back pain not for the leg pain. No history of MRSA. No history of DVT.  Refer him to his urologist who he is seen in the past Dr. Bess Harvest for kidney stones. And probably has serial evaluation of these renal cyst. The him a copy of his MRI report.  Refer him to his surgeon that removed his gallbladder. A copy of his CD and report for his liver cyst to determine whether is any further action need to be taken.  Preoperative clearance.  Plan microlumbar decompression L3-4, L4-5  Cecilie Kicks, PA-C for Dr Tonita Cong 09/22/2020, 1:16 PM

## 2020-09-28 ENCOUNTER — Ambulatory Visit: Payer: Self-pay | Admitting: Orthopedic Surgery

## 2020-10-03 NOTE — Progress Notes (Signed)
Follow-Up Visit   Subjective  Gary Hurst is a 70 y.o. male who presents for the following: Annual Exam (Here for full body skin examination- no new concerns).  Annual skin examination, several spots to check.  History of multiple nonmelanoma skin cancers. Location:  Duration:  Quality:  Associated Signs/Symptoms: Modifying Factors:  Severity:  Timing: Context:   Objective  Well appearing patient in no apparent distress; mood and affect are within normal limits. Right Forearm - Anterior Hornlike 8 mm crust: Favor irritated keratosis over superficial carcinoma so just shave biopsy done without treatment or base     Right Upper Back Ill-defined 1 cm waxy pink crust, superficial BCC.       A full examination was performed including scalp, head, eyes, ears, nose, lips, neck, chest, axillae, abdomen, back, buttocks, bilateral upper extremities, bilateral lower extremities, hands, feet, fingers, toes, fingernails, and toenails. All findings within normal limits unless otherwise noted below.   Assessment & Plan    Screening exam for skin cancer Left Inguinal Area  Neoplasm of uncertain behavior of skin Right Forearm - Anterior  Skin / nail biopsy Type of biopsy: tangential   Informed consent: discussed and consent obtained   Timeout: patient name, date of birth, surgical site, and procedure verified   Anesthesia: the lesion was anesthetized in a standard fashion   Anesthetic:  1% lidocaine w/ epinephrine 1-100,000 local infiltration Instrument used: flexible razor blade   Hemostasis achieved with: aluminum chloride and electrodesiccation   Outcome: patient tolerated procedure well   Post-procedure details: wound care instructions given    Specimen 2 - Surgical pathology Differential Diagnosis: bcc scc ka  Check Margins: No  AK (actinic keratosis) (2) Head - Anterior (Face)  Destruction of lesion - Head - Anterior (Face) Complexity: simple    Destruction method: cryotherapy   Informed consent: discussed and consent obtained   Timeout:  patient name, date of birth, surgical site, and procedure verified Lesion destroyed using liquid nitrogen: Yes   Cryotherapy cycles:  4 Outcome: patient tolerated procedure well with no complications   Post-procedure details: wound care instructions given    Basal cell carcinoma (BCC) of skin of other part of torso Right Upper Back  Skin / nail biopsy Type of biopsy: tangential   Informed consent: discussed and consent obtained   Timeout: patient name, date of birth, surgical site, and procedure verified   Procedure prep:  Patient was prepped and draped in usual sterile fashion (Non sterile) Prep type:  Chlorhexidine Anesthesia: the lesion was anesthetized in a standard fashion   Anesthetic:  1% lidocaine w/ epinephrine 1-100,000 local infiltration Instrument used: flexible razor blade   Outcome: patient tolerated procedure well   Post-procedure details: wound care instructions given    Destruction of lesion Complexity: simple   Destruction method: electrodesiccation and curettage   Informed consent: discussed and consent obtained   Timeout:  patient name, date of birth, surgical site, and procedure verified Anesthesia: the lesion was anesthetized in a standard fashion   Anesthetic:  1% lidocaine w/ epinephrine 1-100,000 local infiltration Curettage performed in three different directions: Yes   Curettage cycles:  3 Lesion length (cm):  1.3 Lesion width (cm):  1.3 Margin per side (cm):  0 Final wound size (cm):  1.3 Hemostasis achieved with:  aluminum chloride Outcome: patient tolerated procedure well with no complications   Post-procedure details: wound care instructions given    Specimen 1 - Surgical pathology Differential Diagnosis: bcc scc curet and cautery  Check Margins: No  For shave biopsy the base was treated with curettage plus cautery.   Other Procedures Placed  This Encounter Dermatology pathology     I, Lavonna Monarch, MD, have reviewed all documentation for this visit.  The documentation on 10/03/20 for the exam, diagnosis, procedures, and orders are all accurate and complete.

## 2020-10-12 NOTE — Progress Notes (Signed)
Surgical Instructions    Your procedure is scheduled on Thursday, August 25th.  Report to [facility] Main Entrance "A" at 5:30 A.M., then check in with the Admitting office.  Call this number if you have problems the morning of surgery:  [phone]   If you have any questions prior to your surgery date call [phone]: Open Monday-Friday 8am-4pm    Remember:  Do not eat after midnight the night before your surgery  You may drink clear liquids until 4:30 AM the morning of your surgery.   Clear liquids allowed are: Water, Non-Citrus Juices (without pulp), Carbonated Beverages, Clear Tea, Black Coffee Only, and Gatorade  Please complete your PRE-SURGERY ENSURE that was provided to you by 4:30 AM the morning of surgery.  Please, if able, drink it in one setting. DO NOT SIP.     Take these medicines the morning of surgery with A SIP OF WATER  Oxycodone-Acetaminophen (Percocet) - if needed   As of today, STOP taking any Aspirin (unless otherwise instructed by your surgeon) Aleve, Naproxen, Ibuprofen, Motrin, Advil, Goody's, BC's, all herbal medications, fish oil, and all vitamins.          Do not wear jewelry Do not wear lotions, powders, colognes, or deodorant. Men may shave face and neck. Do not bring valuables to the hospital.             [facility] is not responsible for any belongings or valuables.  Do NOT Smoke (Tobacco/Vaping) or drink Alcohol 24 hours prior to your procedure If you use a CPAP at night, you may bring all equipment for your overnight stay.   Contacts, glasses, dentures or bridgework may not be worn into surgery, please bring cases for these belongings   For patients admitted to the hospital, discharge time will be determined by your treatment team.   Patients discharged the day of surgery will not be allowed to drive home, and someone needs to stay with them for 24 hours.  ONLY 1 SUPPORT PERSON MAY BE PRESENT WHILE YOU ARE IN SURGERY. IF YOU ARE TO BE  ADMITTED ONCE YOU ARE IN YOUR ROOM YOU WILL BE ALLOWED TWO (2) VISITORS.  Minor children may have two parents present. Special consideration for safety and communication needs will be reviewed on a case by case basis.  Special instructions:    Oral Hygiene is also important to reduce your risk of infection.  Remember - BRUSH YOUR TEETH THE MORNING OF SURGERY WITH YOUR REGULAR TOOTHPASTE   [facility]- Preparing For Surgery  Before surgery, you can play an important role. Because skin is not sterile, your skin needs to be as free of germs as possible. You can reduce the number of germs on your skin by washing with CHG (chlorahexidine gluconate) Soap before surgery.  CHG is an antiseptic cleaner which kills germs and bonds with the skin to continue killing germs even after washing.     Please do not use if you have an allergy to CHG or antibacterial soaps. If your skin becomes reddened/irritated stop using the CHG.  Do not shave (including legs and underarms) for at least 48 hours prior to first CHG shower. It is OK to shave your face.  Please follow these instructions carefully.     Shower the NIGHT BEFORE SURGERY and the MORNING OF SURGERY with CHG Soap.   If you chose to wash your hair, wash your hair first as usual with your normal shampoo. After you shampoo, rinse your hair  and body thoroughly to remove the shampoo.  Then [employer] and genitals (private parts) with your normal soap and rinse thoroughly to remove soap.  After that Use CHG Soap as you would any other liquid soap. You can apply CHG directly to the skin and wash gently with a scrungie or a clean washcloth.   Apply the CHG Soap to your body ONLY FROM THE NECK DOWN.  Do not use on open wounds or open sores. Avoid contact with your eyes, ears, mouth and genitals (private parts). Wash Face and genitals (private parts)  with your normal soap.   Wash thoroughly, paying special attention to the area where your surgery will be  performed.  Thoroughly rinse your body with warm water from the neck down.  DO NOT shower/wash with your normal soap after using and rinsing off the CHG Soap.  Pat yourself dry with a CLEAN TOWEL.  Wear CLEAN PAJAMAS to bed the night before surgery  Place CLEAN SHEETS on your bed the night before your surgery  DO NOT SLEEP WITH PETS.   Day of Surgery:  Take a shower with CHG soap. Wear Clean/Comfortable clothing the morning of surgery Do not apply any deodorants/lotions.   Remember to brush your teeth WITH YOUR REGULAR TOOTHPASTE.   Please read over the following fact sheets that you were given.

## 2020-10-13 ENCOUNTER — Encounter (HOSPITAL_COMMUNITY)
Admission: RE | Admit: 2020-10-13 | Discharge: 2020-10-13 | Disposition: A | Discharge status: Home / Self Care | Payer: Medicare Other | Source: Ambulatory Visit | Attending: Specialist | Admitting: Specialist

## 2020-10-13 ENCOUNTER — Other Ambulatory Visit: Payer: Self-pay

## 2020-10-13 ENCOUNTER — Encounter (HOSPITAL_COMMUNITY): Payer: Self-pay

## 2020-10-13 DIAGNOSIS — M4317 Spondylolisthesis, lumbosacral region: Secondary | ICD-10-CM | POA: Diagnosis not present

## 2020-10-13 DIAGNOSIS — M5126 Other intervertebral disc displacement, lumbar region: Secondary | ICD-10-CM

## 2020-10-13 DIAGNOSIS — Z01812 Encounter for preprocedural laboratory examination: Secondary | ICD-10-CM | POA: Diagnosis not present

## 2020-10-13 DIAGNOSIS — M5136 Other intervertebral disc degeneration, lumbar region: Secondary | ICD-10-CM | POA: Insufficient documentation

## 2020-10-13 DIAGNOSIS — I700 Atherosclerosis of aorta: Secondary | ICD-10-CM | POA: Diagnosis not present

## 2020-10-13 HISTORY — DX: Inflammatory liver disease, unspecified: K75.9

## 2020-10-13 LAB — CBC
HCT: 45.4 % (ref 39.0–52.0)
Hemoglobin: 16.3 g/dL (ref 13.0–17.0)
MCH: 31.4 pg (ref 26.0–34.0)
MCHC: 35.9 g/dL (ref 30.0–36.0)
MCV: 87.5 fL (ref 80.0–100.0)
Platelets: 204 10*3/uL (ref 150–400)
RBC: 5.19 MIL/uL (ref 4.22–5.81)
RDW: 12.4 % (ref 11.5–15.5)
WBC: 6.9 10*3/uL (ref 4.0–10.5)
nRBC: 0 % (ref 0.0–0.2)

## 2020-10-13 LAB — SURGICAL PCR SCREEN
MRSA, PCR: NEGATIVE
Staphylococcus aureus: NEGATIVE

## 2020-10-13 LAB — COMPREHENSIVE METABOLIC PANEL
ALT: 36 U/L (ref 0–44)
AST: 25 U/L (ref 15–41)
Albumin: 4.5 g/dL (ref 3.5–5.0)
Alkaline Phosphatase: 52 U/L (ref 38–126)
Anion gap: 9 (ref 5–15)
BUN: 18 mg/dL (ref 8–23)
CO2: 30 mmol/L (ref 22–32)
Calcium: 9.5 mg/dL (ref 8.9–10.3)
Chloride: 99 mmol/L (ref 98–111)
Creatinine, Ser: 1 mg/dL (ref 0.61–1.24)
GFR, Estimated: >60 mL/min (ref >60)
Glucose, Bld: 123 mg/dL — ABNORMAL HIGH (ref 70–99)
Potassium: 3 mmol/L — ABNORMAL LOW (ref 3.5–5.1)
Sodium: 138 mmol/L (ref 135–145)
Total Bilirubin: 1.8 mg/dL — ABNORMAL HIGH (ref 0.3–1.2)
Total Protein: 7 g/dL (ref 6.5–8.1)

## 2020-10-13 NOTE — Progress Notes (Signed)
PCP: Hulan Fess, MD Cardiologist: denies  EKG: 02/10/20 CXR: na ECHO: denies Stress Test: denies Cardiac Cath: denies  Fasting Blood Sugar- na Checks Blood Sugar_na__ times a day  OSA/CPAP: No  ASA/Blood Thinner: No  Covid test 10/17/20.  Gave instructions, map and requisition to patient  Anesthesia Review: No  Patient denies shortness of breath, fever, cough, and chest pain at PAT appointment.  Patient verbalized understanding of instructions provided today at the PAT appointment.  Patient asked to review instructions at home and day of surgery.

## 2020-10-17 ENCOUNTER — Other Ambulatory Visit: Payer: Self-pay | Admitting: Specialist

## 2020-10-18 LAB — SARS CORONAVIRUS 2 (TAT 6-24 HRS): SARS Coronavirus 2: NEGATIVE

## 2020-10-20 ENCOUNTER — Ambulatory Visit (HOSPITAL_COMMUNITY): Payer: Medicare Other | Admitting: Physician Assistant

## 2020-10-20 ENCOUNTER — Ambulatory Visit (HOSPITAL_COMMUNITY): Payer: Medicare Other | Admitting: Certified Registered"

## 2020-10-20 ENCOUNTER — Other Ambulatory Visit: Payer: Self-pay

## 2020-10-20 ENCOUNTER — Encounter (HOSPITAL_COMMUNITY): Payer: Self-pay | Admitting: Specialist

## 2020-10-20 ENCOUNTER — Ambulatory Visit (HOSPITAL_COMMUNITY): Payer: Medicare Other

## 2020-10-20 ENCOUNTER — Ambulatory Visit (HOSPITAL_COMMUNITY)
Admission: RE | Admit: 2020-10-20 | Discharge: 2020-10-21 | Disposition: A | Discharge status: Home / Self Care | Payer: Medicare Other | Attending: Specialist | Admitting: Specialist

## 2020-10-20 ENCOUNTER — Encounter (HOSPITAL_COMMUNITY)
Admission: RE | Disposition: A | Discharge status: Home / Self Care | Payer: Self-pay | Source: Home / Self Care | Attending: Specialist

## 2020-10-20 DIAGNOSIS — Z880 Allergy status to penicillin: Secondary | ICD-10-CM | POA: Diagnosis not present

## 2020-10-20 DIAGNOSIS — Z87891 Personal history of nicotine dependence: Secondary | ICD-10-CM | POA: Diagnosis not present

## 2020-10-20 DIAGNOSIS — Z791 Long term (current) use of non-steroidal anti-inflammatories (NSAID): Secondary | ICD-10-CM | POA: Diagnosis not present

## 2020-10-20 DIAGNOSIS — Z85828 Personal history of other malignant neoplasm of skin: Secondary | ICD-10-CM | POA: Insufficient documentation

## 2020-10-20 DIAGNOSIS — Z79899 Other long term (current) drug therapy: Secondary | ICD-10-CM | POA: Insufficient documentation

## 2020-10-20 DIAGNOSIS — M48061 Spinal stenosis, lumbar region without neurogenic claudication: Secondary | ICD-10-CM | POA: Diagnosis present

## 2020-10-20 DIAGNOSIS — M5116 Intervertebral disc disorders with radiculopathy, lumbar region: Secondary | ICD-10-CM | POA: Insufficient documentation

## 2020-10-20 DIAGNOSIS — Z419 Encounter for procedure for purposes other than remedying health state, unspecified: Secondary | ICD-10-CM

## 2020-10-20 DIAGNOSIS — Z79891 Long term (current) use of opiate analgesic: Secondary | ICD-10-CM | POA: Insufficient documentation

## 2020-10-20 HISTORY — PX: LUMBAR LAMINECTOMY/DECOMPRESSION MICRODISCECTOMY: SHX5026

## 2020-10-20 SURGERY — LUMBAR LAMINECTOMY/DECOMPRESSION MICRODISCECTOMY 2 LEVELS
Anesthesia: General

## 2020-10-20 MED ORDER — 0.9 % SODIUM CHLORIDE (POUR BTL) OPTIME
TOPICAL | Status: DC | PRN
  Administered 2020-10-20: 1000 mL

## 2020-10-20 MED ORDER — ACETAMINOPHEN 325 MG PO TABS
650.0000 mg | ORAL_TABLET | ORAL | Status: DC | PRN
  Administered 2020-10-20 – 2020-10-21 (×2): 650 mg via ORAL
  Filled 2020-10-20 (×2): qty 2

## 2020-10-20 MED ORDER — POLYETHYLENE GLYCOL 3350 17 G PO PACK: 17.0000 g | PACK | Freq: Every day | ORAL | 0 refills | Status: DC

## 2020-10-20 MED ORDER — LIDOCAINE 2% (20 MG/ML) 5 ML SYRINGE
INTRAMUSCULAR | Status: DC | PRN
Start: 2020-10-20 — End: 2020-10-20
  Administered 2020-10-20: 60 mg via INTRAVENOUS

## 2020-10-20 MED ORDER — ONDANSETRON HCL 4 MG PO TABS: 4.0000 mg | ORAL_TABLET | Freq: Four times a day (QID) | ORAL | Status: DC | PRN

## 2020-10-20 MED ORDER — GLYCOPYRROLATE PF 0.2 MG/ML IJ SOSY
PREFILLED_SYRINGE | INTRAMUSCULAR | Status: DC | PRN
  Administered 2020-10-20 (×2): .1 mg via INTRAVENOUS

## 2020-10-20 MED ORDER — RAMIPRIL 5 MG PO CAPS
10.0000 mg | ORAL_CAPSULE | Freq: Every morning | ORAL | Status: DC
  Administered 2020-10-21: 10 mg via ORAL
  Filled 2020-10-20: qty 2

## 2020-10-20 MED ORDER — MIDAZOLAM HCL 5 MG/5ML IJ SOLN
INTRAMUSCULAR | Status: DC | PRN
Start: 2020-10-20 — End: 2020-10-20
  Administered 2020-10-20: 2 mg via INTRAVENOUS

## 2020-10-20 MED ORDER — PROPOFOL 10 MG/ML IV BOLUS
INTRAVENOUS | Status: DC | PRN
  Administered 2020-10-20: 200 mg via INTRAVENOUS

## 2020-10-20 MED ORDER — METHOCARBAMOL 1000 MG/10ML IJ SOLN
500.0000 mg | Freq: Four times a day (QID) | INTRAVENOUS | Status: DC | PRN
  Filled 2020-10-20: qty 5

## 2020-10-20 MED ORDER — ONDANSETRON HCL 4 MG/2ML IJ SOLN: 4.0000 mg | Freq: Four times a day (QID) | INTRAMUSCULAR | Status: DC | PRN

## 2020-10-20 MED ORDER — PROPOFOL 10 MG/ML IV BOLUS
INTRAVENOUS | Status: AC
  Filled 2020-10-20: qty 40

## 2020-10-20 MED ORDER — THROMBIN 20000 UNITS EX SOLR
CUTANEOUS | Status: AC
  Filled 2020-10-20: qty 20000

## 2020-10-20 MED ORDER — HYDROMORPHONE HCL 1 MG/ML IJ SOLN: 0.5000 mg | INTRAMUSCULAR | Status: DC | PRN

## 2020-10-20 MED ORDER — MENTHOL 3 MG MT LOZG: 1.0000 | LOZENGE | OROMUCOSAL | Status: DC | PRN

## 2020-10-20 MED ORDER — DEXAMETHASONE SODIUM PHOSPHATE 10 MG/ML IJ SOLN
INTRAMUSCULAR | Status: DC | PRN
  Administered 2020-10-20: 10 mg via INTRAVENOUS

## 2020-10-20 MED ORDER — BISACODYL 5 MG PO TBEC: 5.0000 mg | DELAYED_RELEASE_TABLET | Freq: Every day | ORAL | Status: DC | PRN

## 2020-10-20 MED ORDER — OXYCODONE HCL 5 MG PO TABS
10.0000 mg | ORAL_TABLET | ORAL | Status: DC | PRN
  Administered 2020-10-20 – 2020-10-21 (×6): 10 mg via ORAL
  Filled 2020-10-20 (×6): qty 2

## 2020-10-20 MED ORDER — OXYCODONE HCL 5 MG PO TABS: 5.0000 mg | ORAL_TABLET | ORAL | 0 refills | Status: AC | PRN

## 2020-10-20 MED ORDER — CEFAZOLIN SODIUM-DEXTROSE 2-4 GM/100ML-% IV SOLN
2.0000 g | Freq: Three times a day (TID) | INTRAVENOUS | Status: AC
  Administered 2020-10-20 – 2020-10-21 (×3): 2 g via INTRAVENOUS
  Filled 2020-10-20 (×3): qty 100

## 2020-10-20 MED ORDER — OXYCODONE HCL 5 MG PO TABS
5.0000 mg | ORAL_TABLET | ORAL | Status: DC | PRN
Start: 2020-10-20 — End: 2020-10-21

## 2020-10-20 MED ORDER — INDAPAMIDE 2.5 MG PO TABS
2.5000 mg | ORAL_TABLET | Freq: Every day | ORAL | Status: DC
  Administered 2020-10-20 – 2020-10-21 (×2): 2.5 mg via ORAL
  Filled 2020-10-20 (×2): qty 1

## 2020-10-20 MED ORDER — FENTANYL CITRATE (PF) 250 MCG/5ML IJ SOLN
INTRAMUSCULAR | Status: AC
  Filled 2020-10-20: qty 5

## 2020-10-20 MED ORDER — DOCUSATE SODIUM 100 MG PO CAPS: 100.0000 mg | ORAL_CAPSULE | Freq: Two times a day (BID) | ORAL | 1 refills | Status: DC | PRN

## 2020-10-20 MED ORDER — POLYETHYLENE GLYCOL 3350 17 G PO PACK: 17.0000 g | PACK | Freq: Every day | ORAL | Status: DC | PRN

## 2020-10-20 MED ORDER — FENTANYL CITRATE (PF) 250 MCG/5ML IJ SOLN
INTRAMUSCULAR | Status: DC | PRN
  Administered 2020-10-20: 25 ug via INTRAVENOUS
  Administered 2020-10-20: 100 ug via INTRAVENOUS
  Administered 2020-10-20: 25 ug via INTRAVENOUS

## 2020-10-20 MED ORDER — MIDAZOLAM HCL 2 MG/2ML IJ SOLN
INTRAMUSCULAR | Status: AC
  Filled 2020-10-20: qty 2

## 2020-10-20 MED ORDER — ACETAMINOPHEN 650 MG RE SUPP: 650.0000 mg | RECTAL | Status: DC | PRN

## 2020-10-20 MED ORDER — ALUM & MAG HYDROXIDE-SIMETH 200-200-20 MG/5ML PO SUSP: 30.0000 mL | Freq: Four times a day (QID) | ORAL | Status: DC | PRN

## 2020-10-20 MED ORDER — SACCHAROMYCES BOULARDII 250 MG PO CAPS
250.0000 mg | ORAL_CAPSULE | Freq: Every day | ORAL | Status: DC
  Administered 2020-10-20 – 2020-10-21 (×2): 250 mg via ORAL
  Filled 2020-10-20 (×2): qty 1

## 2020-10-20 MED ORDER — ONDANSETRON HCL 4 MG/2ML IJ SOLN
INTRAMUSCULAR | Status: DC | PRN
  Administered 2020-10-20: 4 mg via INTRAVENOUS

## 2020-10-20 MED ORDER — THROMBIN 20000 UNITS EX SOLR
CUTANEOUS | Status: DC | PRN
  Administered 2020-10-20: 20 mL via TOPICAL

## 2020-10-20 MED ORDER — EPHEDRINE 5 MG/ML INJ
INTRAVENOUS | Status: AC
  Filled 2020-10-20: qty 5

## 2020-10-20 MED ORDER — SUGAMMADEX SODIUM 200 MG/2ML IV SOLN
INTRAVENOUS | Status: DC | PRN
  Administered 2020-10-20: 200 mg via INTRAVENOUS

## 2020-10-20 MED ORDER — ORAL CARE MOUTH RINSE: 15.0000 mL | Freq: Once | OROMUCOSAL | Status: AC

## 2020-10-20 MED ORDER — BUPIVACAINE-EPINEPHRINE 0.5% -1:200000 IJ SOLN
INTRAMUSCULAR | Status: AC
  Filled 2020-10-20: qty 1

## 2020-10-20 MED ORDER — PHENOL 1.4 % MT LIQD: 1.0000 | OROMUCOSAL | Status: DC | PRN

## 2020-10-20 MED ORDER — FENTANYL CITRATE (PF) 100 MCG/2ML IJ SOLN
25.0000 ug | INTRAMUSCULAR | Status: DC | PRN
  Administered 2020-10-20: 25 ug via INTRAVENOUS

## 2020-10-20 MED ORDER — PROBIOTIC DAILY PO CAPS: ORAL_CAPSULE | Freq: Every day | ORAL | Status: DC

## 2020-10-20 MED ORDER — EPHEDRINE SULFATE-NACL 50-0.9 MG/10ML-% IV SOSY
PREFILLED_SYRINGE | INTRAVENOUS | Status: DC | PRN
  Administered 2020-10-20: 10 mg via INTRAVENOUS
  Administered 2020-10-20 (×3): 5 mg via INTRAVENOUS

## 2020-10-20 MED ORDER — ROCURONIUM BROMIDE 10 MG/ML (PF) SYRINGE
PREFILLED_SYRINGE | INTRAVENOUS | Status: DC | PRN
Start: 2020-10-20 — End: 2020-10-20
  Administered 2020-10-20: 40 mg via INTRAVENOUS
  Administered 2020-10-20: 10 mg via INTRAVENOUS
  Administered 2020-10-20: 60 mg via INTRAVENOUS

## 2020-10-20 MED ORDER — LACTATED RINGERS IV SOLN: INTRAVENOUS | Status: DC

## 2020-10-20 MED ORDER — ACETAMINOPHEN 10 MG/ML IV SOLN
1000.0000 mg | INTRAVENOUS | Status: AC
  Administered 2020-10-20: 1000 mg via INTRAVENOUS
  Filled 2020-10-20: qty 100

## 2020-10-20 MED ORDER — METHOCARBAMOL 500 MG PO TABS
500.0000 mg | ORAL_TABLET | Freq: Four times a day (QID) | ORAL | Status: DC | PRN
  Administered 2020-10-20 – 2020-10-21 (×3): 500 mg via ORAL
  Filled 2020-10-20 (×3): qty 1

## 2020-10-20 MED ORDER — AMISULPRIDE (ANTIEMETIC) 5 MG/2ML IV SOLN: 10.0000 mg | Freq: Once | INTRAVENOUS | Status: DC | PRN

## 2020-10-20 MED ORDER — DOCUSATE SODIUM 100 MG PO CAPS
100.0000 mg | ORAL_CAPSULE | Freq: Two times a day (BID) | ORAL | Status: DC
  Administered 2020-10-20 – 2020-10-21 (×3): 100 mg via ORAL
  Filled 2020-10-20 (×3): qty 1

## 2020-10-20 MED ORDER — KCL IN DEXTROSE-NACL 40-5-0.45 MEQ/L-%-% IV SOLN
INTRAVENOUS | Status: DC
  Filled 2020-10-20: qty 1000

## 2020-10-20 MED ORDER — TRANEXAMIC ACID-NACL 1000-0.7 MG/100ML-% IV SOLN
1000.0000 mg | INTRAVENOUS | Status: AC
  Administered 2020-10-20: 1000 mg via INTRAVENOUS
  Filled 2020-10-20: qty 100

## 2020-10-20 MED ORDER — CHLORHEXIDINE GLUCONATE 0.12 % MT SOLN
15.0000 mL | Freq: Once | OROMUCOSAL | Status: AC
  Administered 2020-10-20: 15 mL via OROMUCOSAL
  Filled 2020-10-20: qty 15

## 2020-10-20 MED ORDER — CEFAZOLIN SODIUM-DEXTROSE 2-4 GM/100ML-% IV SOLN
2.0000 g | INTRAVENOUS | Status: AC
  Administered 2020-10-20: 2 g via INTRAVENOUS
  Filled 2020-10-20: qty 100

## 2020-10-20 MED ORDER — FENTANYL CITRATE (PF) 100 MCG/2ML IJ SOLN
INTRAMUSCULAR | Status: AC
  Filled 2020-10-20: qty 2

## 2020-10-20 MED ORDER — ACETAMINOPHEN 500 MG PO TABS: 1000.0000 mg | ORAL_TABLET | Freq: Once | ORAL | Status: DC

## 2020-10-20 SURGICAL SUPPLY — 62 items
BAG COUNTER SPONGE SURGICOUNT (BAG) ×3 IMPLANT
BAG DECANTER FOR FLEXI CONT (MISCELLANEOUS) ×2 IMPLANT
BAG SPNG CNTER NS LX DISP (BAG) ×2
BAND INSRT 18 STRL LF DISP RB (MISCELLANEOUS) ×2
BAND RUBBER #18 3X1/16 STRL (MISCELLANEOUS) ×4 IMPLANT
BUR RND DIAMOND ELITE 4.0 (BURR) IMPLANT
BUR STRYKR EGG 5.0 (BURR) IMPLANT
CLEANER TIP ELECTROSURG 2X2 (MISCELLANEOUS) ×2 IMPLANT
CNTNR URN SCR LID CUP LEK RST (MISCELLANEOUS) ×1 IMPLANT
CONT SPEC 4OZ STRL OR WHT (MISCELLANEOUS) ×2
DRAPE LAPAROTOMY 100X72X124 (DRAPES) ×2 IMPLANT
DRAPE MICROSCOPE LEICA (MISCELLANEOUS) ×2 IMPLANT
DRAPE SHEET LG 3/4 BI-LAMINATE (DRAPES) ×2 IMPLANT
DRAPE SURG 17X11 SM STRL (DRAPES) ×2 IMPLANT
DRAPE UTILITY XL STRL (DRAPES) ×2 IMPLANT
DRESSING AQUACEL AG SP 3.5X6 (GAUZE/BANDAGES/DRESSINGS) IMPLANT
DRSG AQUACEL AG ADV 3.5X 4 (GAUZE/BANDAGES/DRESSINGS) IMPLANT
DRSG AQUACEL AG ADV 3.5X 6 (GAUZE/BANDAGES/DRESSINGS) IMPLANT
DRSG AQUACEL AG SP 3.5X6 (GAUZE/BANDAGES/DRESSINGS) ×2
DRSG OPSITE POSTOP 3X4 (GAUZE/BANDAGES/DRESSINGS) ×1 IMPLANT
DRSG TELFA 3X8 NADH (GAUZE/BANDAGES/DRESSINGS) IMPLANT
DURAPREP 26ML APPLICATOR (WOUND CARE) ×2 IMPLANT
DURASEAL SPINE SEALANT 3ML (MISCELLANEOUS) IMPLANT
ELECT BLADE 4.0 EZ CLEAN MEGAD (MISCELLANEOUS) ×2
ELECT REM PT RETURN 9FT ADLT (ELECTROSURGICAL) ×2
ELECTRODE BLDE 4.0 EZ CLN MEGD (MISCELLANEOUS) IMPLANT
ELECTRODE REM PT RTRN 9FT ADLT (ELECTROSURGICAL) ×1 IMPLANT
EVACUATOR 1/8 PVC DRAIN (DRAIN) ×1 IMPLANT
GLOVE SURG POLYISO LF SZ7.5 (GLOVE) ×2 IMPLANT
GLOVE SURG POLYISO LF SZ8 (GLOVE) ×4 IMPLANT
GLOVE SURG UNDER POLY LF SZ7 (GLOVE) ×2 IMPLANT
GOWN STRL REUS W/ TWL LRG LVL3 (GOWN DISPOSABLE) ×1 IMPLANT
GOWN STRL REUS W/ TWL XL LVL3 (GOWN DISPOSABLE) ×1 IMPLANT
GOWN STRL REUS W/TWL LRG LVL3 (GOWN DISPOSABLE) ×4
GOWN STRL REUS W/TWL XL LVL3 (GOWN DISPOSABLE) ×4
IV CATH 14GX2 1/4 (CATHETERS) ×2 IMPLANT
KIT BASIN OR (CUSTOM PROCEDURE TRAY) ×2 IMPLANT
KIT POSITION SURG JACKSON T1 (MISCELLANEOUS) IMPLANT
NDL SPNL 18GX3.5 QUINCKE PK (NEEDLE) ×2 IMPLANT
NEEDLE 22X1 1/2 (OR ONLY) (NEEDLE) ×2 IMPLANT
NEEDLE SPNL 18GX3.5 QUINCKE PK (NEEDLE) ×4 IMPLANT
PACK LAMINECTOMY NEURO (CUSTOM PROCEDURE TRAY) ×2 IMPLANT
PAD DRESSING TELFA 3X8 NADH (GAUZE/BANDAGES/DRESSINGS) IMPLANT
PATTIES SURGICAL .75X.75 (GAUZE/BANDAGES/DRESSINGS) ×2 IMPLANT
SPONGE SURGIFOAM ABS GEL 100 (HEMOSTASIS) ×2 IMPLANT
SPONGE T-LAP 4X18 ~~LOC~~+RFID (SPONGE) ×3 IMPLANT
STAPLER VISISTAT (STAPLE) ×1 IMPLANT
STRIP CLOSURE SKIN 1/2X4 (GAUZE/BANDAGES/DRESSINGS) ×1 IMPLANT
SUT BONE WAX W31G (SUTURE) ×1 IMPLANT
SUT NURALON 4 0 TR CR/8 (SUTURE) IMPLANT
SUT PROLENE 3 0 PS 2 (SUTURE) IMPLANT
SUT VIC AB 1 CT1 27 (SUTURE) ×2
SUT VIC AB 1 CT1 27XBRD ANTBC (SUTURE) IMPLANT
SUT VIC AB 1-0 CT2 27 (SUTURE) ×1 IMPLANT
SUT VIC AB 2-0 CT1 27 (SUTURE) ×2
SUT VIC AB 2-0 CT1 TAPERPNT 27 (SUTURE) IMPLANT
SUT VIC AB 2-0 CT2 27 (SUTURE) ×1 IMPLANT
SYR 3ML LL SCALE MARK (SYRINGE) ×2 IMPLANT
TOWEL GREEN STERILE (TOWEL DISPOSABLE) ×2 IMPLANT
TOWEL GREEN STERILE FF (TOWEL DISPOSABLE) ×2 IMPLANT
TRAY FOLEY MTR SLVR 16FR STAT (SET/KITS/TRAYS/PACK) ×2 IMPLANT
YANKAUER SUCT BULB TIP NO VENT (SUCTIONS) ×2 IMPLANT

## 2020-10-20 NOTE — Op Note (Signed)
NAME: Gary Hurst, FUHRMANN MEDICAL RECORD NO: [id number] ACCOUNT NO: [id number] DATE OF BIRTH: [birth year] FACILITY: [facility] LOCATION: [facility] PHYSICIAN: Johnn Hai, MD  Operative Report   DATE OF PROCEDURE: 10/20/2020  PREOPERATIVE DIAGNOSIS:  Spinal stenosis, L3-L4, L4-L5.  POSTOPERATIVE DIAGNOSIS:  Spinal stenosis, L3-L4, L4-L5.  PROCEDURE PERFORMED:  Microlumbar decompression L4-L5, L3-L4 with bilateral foraminotomies at L4, L5 and L3.  ANESTHESIA:  General.  ASSISTANT:  Lacie Draft, PA  HISTORY:  A 70 year old with predominant left lower extremity radicular pain, L5 nerve root distribution, secondary lateral recess stenosis at L4-L5.  He had a central stenosis at L3-L4 and lateral recess stenosis 3-4 to the left.  He was indicated for  microlumbar decompression.  Risks and benefits discussed including bleeding, infection, damage to neurovascular structures, change in symptoms, worsening symptoms, DVT, PE, anesthetic complications, etc.  TECHNIQUE:  The patient in supine position.  After induction of adequate general anesthesia, 2 grams Kefzol, Placed prone on the Wilson frame.  Foley to gravity.  All bony prominences were well padded.  Lumbar region was prepped and draped in the usual  sterile fashion.  Two 18-gauge spinal needle was utilized to localize the 3-4 and 4-5 interspace, confirmed with x-ray.  An incision was made from the spinous process of 3 to below 5.  Subcutaneous tissue was dissected.  Electrocautery was utilized to  achieve hemostasis.  Dorsal lumbar fascia divided in line with the skin incision.  Paraspinous muscles elevated from the lamina of 3-4 and 4-5.  McCulloch retractor was placed.  Operating microscope was draped and brought in the surgical field.  Kochers  were placed on the spinous processes of 3 and 5.  Leksell rongeur was then utilized to remove the spinous process of 4 and partially at 3 and 5.  Bone wax was placed on the cancellous surfaces.  Next,  attention was turned first to L4-L5.   Hemilaminotomies were performed of the caudad edge of 4 bilaterally.  We used Leksell rongeur to remove part of the lamina as well.  We detached ligamentum flavum from the cephalad edge of 5 with a straight curette.  Ligamentum flavum removed from the  interspace.  We started centrally with a [place]son retractor and a 2 mm Kerrison detached from the ligamentum flavum from its caudad edge of 4 performing the hemilaminotomies.  I then decompressed both lateral recesses to the medial border of the pedicle.   After performing foraminotomies of L5 bilaterally protecting the L5 nerve roots.  We gently mobilizing the L5 nerve root medially, then decompressed the lateral recess to the medial border of the pedicle bilaterally.  There is severe stenosis in the  lateral recess noted.  This is due to facet ligamentum flavum hypertrophy and epidural venous plexus was noted and cauterized.  We evaluated the disk, there was no evidence of disk herniation.  Bone wax was placed on the cancellous surfaces.  A neuro  probe passed freely out the foramen of 5 and 4 bilaterally.  Good restoration of the thecal sac.  There was thinning of the thecal sac dorsally.  Next, we covered it with thrombin-soaked Gelfoam and turned our attention to 3-4.  I performed a  hemilaminotomy of the caudad edges of 3 bilaterally with a 2 and a 3 mm Kerrison.  Detached ligamentum flavum from the cephalad edge of 4 with a straight curette.  We then removed ligamentum flavum from the interspace.  There was some central and lateral  recess stenosis noted here.  I performed  a hemilaminotomies bilaterally for the cephalad edge of 4.  After gently mobilizing the 4 roots medially.  The decompressed lateral recess to the medial border of the pedicle, bilateral lateral recess stenosis  was noted as well.  I performed foraminotomies of the L4 and L3.  Epidural venous plexus was noted and cauterized.  There was a bulging  disk at 3-4 to the left, but not compressed and 3 of the 4 nerve roots.  Following my decompression, the [facility]  retractor passed freely out the foramen with 3 and 4 bilaterally.  Good decompression of the thecal sac and 3 and 4 roots.  Copiously irrigated with antibiotic irrigation.  Bone wax was placed in all cancellous surfaces.  We placed thrombin-soaked  Gelfoam very small 2 mm pieces in the lateral recess over the epidural venous plexus 3-4 on the left and 4-5 on the left.  One small piece thrombin-soaked Gelfoam under the lamina of 5 caudad.  A Valsalva to 40 was then performed.  There was no evidence  of CSF leakage.  Confirmatory radiograph obtained with [place] retractors in foramen of 3 and 5.  Next, I removed the McCulloch retractor copiously irrigated the paraspinous musculature.  Bipolar cautery was utilized to achieve hemostasis.  There was  minor bleeding I felt it is appropriate to put a Hemovac in which I did brought out through a stab wound in the skin through the fascia.  I then closed the dorsal lumbar fascia with #1 Vicryl in interrupted figure-of-eight sutures, subcutaneous with 2-0  and skin with staples.  Wound was dressed sterilely, placed supine on the hospital bed, extubated without difficulty and transported to the recovery room in satisfactory condition.  The patient tolerated the procedure well.  No complications.  BLOOD LOSS:  150 mL.   PUS D: 10/20/2020 10:35:46 am T: 10/20/2020 3:42:00 pm  JOB: [id number]

## 2020-10-20 NOTE — Brief Op Note (Signed)
10/20/2020  10:26 AM  PATIENT:  Gary Hurst  70 y.o. male  PRE-OPERATIVE DIAGNOSIS:  Spinal stenosis Lumbar four-five  POST-OPERATIVE DIAGNOSIS:  Spinal stenosis Lumbar four-five  PROCEDURE:  Procedure(s): Microlumbar decompression Lumbar three-four, Lumbar four-five (N/A)  SURGEON:  Surgeon(s) and Role:    Susa Day, MD - Primary  PHYSICIAN ASSISTANT:   ASSISTANTS: Bissell   ANESTHESIA:   general  EBL:  200 mL   BLOOD ADMINISTERED:none  DRAINS: (1) Hemovact drain(s) in the open with  Suction Clamped   LOCAL MEDICATIONS USED:  MARCAINE     SPECIMEN:  No Specimen  DISPOSITION OF SPECIMEN:  N/A  COUNTS:  YES  TOURNIQUET:  * No tourniquets in log *  DICTATION: .Other Dictation: Dictation Number [id number]  PLAN OF CARE: Admit for overnight observation  PATIENT DISPOSITION:  PACU - hemodynamically stable.   Delay start of Pharmacological VTE agent (>24hrs) due to surgical blood loss or risk of bleeding: yes

## 2020-10-20 NOTE — Transfer of Care (Signed)
Immediate Anesthesia Transfer of Care Note  Patient: Gary Hurst  Procedure(s) Performed: Microlumbar decompression Lumbar three-four, Lumbar four-five  Patient Location: PACU  Anesthesia Type:General  Level of Consciousness: awake, alert  and oriented  Airway & Oxygen Therapy: Patient Spontanous Breathing and Patient connected to face mask oxygen  Post-op Assessment: Report given to RN and Post -op Vital signs reviewed and stable  Post vital signs: Reviewed and stable  Last Vitals:  Vitals Value Taken Time  BP 122/66 10/20/20 1032  Temp    Pulse 75 10/20/20 1034  Resp 12 10/20/20 1034  SpO2 100 % 10/20/20 1034  Vitals shown include unvalidated device data.  Last Pain:  Vitals:   10/20/20 0620  TempSrc:   PainSc: 4          Complications: No notable events documented.

## 2020-10-20 NOTE — Anesthesia Procedure Notes (Signed)
Procedure Name: Intubation Date/Time: 10/20/2020 7:44 AM Performed by: Griffin Dakin, CRNA Pre-anesthesia Checklist: Patient identified, Emergency Drugs available, Suction available and Patient being monitored Patient Re-evaluated:Patient Re-evaluated prior to induction Oxygen Delivery Method: Circle system utilized Preoxygenation: Pre-oxygenation with 100% oxygen Induction Type: IV induction Ventilation: Oral airway inserted - appropriate to patient size and Two handed mask ventilation required Laryngoscope Size: Glidescope and 4 Grade View: Grade I Tube type: Oral Tube size: 7.5 mm Number of attempts: 1 Airway Equipment and Method: Stylet and Oral airway Placement Confirmation: ETT inserted through vocal cords under direct vision, positive ETCO2 and breath sounds checked- equal and bilateral Secured at: 25 cm Tube secured with: Tape Dental Injury: Teeth and Oropharynx as per pre-operative assessment  Comments: Grade III view with MAC 4, grade I view with gliidescpe

## 2020-10-20 NOTE — Discharge Instructions (Signed)

## 2020-10-20 NOTE — Interval H&P Note (Signed)
History and Physical Interval Note:  10/20/2020 7:13 AM  Gary Hurst  has presented today for surgery, with the diagnosis of Spinal stenosis L4-5.  The various methods of treatment have been discussed with the patient and family. After consideration of risks, benefits and other options for treatment, the patient has consented to  Procedure(s): Microlumbar decompression L3-4, L4-5 (N/A) as a surgical intervention.  The patient's history has been reviewed, patient examined, no change in status, stable for surgery.  I have reviewed the patient's chart and labs.  Questions were answered to the patient's satisfaction.     Doroteo Bradford [name]  Left EHL/DF 4/5

## 2020-10-20 NOTE — Evaluation (Signed)
Occupational Therapy Evaluation Patient Details Name: Gary Hurst MRN: [id number] DOB: September 25, 1950 Today's Date: 10/20/2020    History of Present Illness 70 y.o. male presenting for elective L3-4 and L4-5 lumbar decompression secondary to spinal stenosis. PMHx significant for skin cancer, HLD and HTN.   Clinical Impression   PTA patient was living with his spouse in a private residence and was independent with ADLs/IADLs without AD. Patient was working full-time for [employer]. Patient currently presents near baseline level of function demonstrating observed ADLs including LB dressing, toileting/hygiene/clothing management and hand hygiene standing at sink level with Mod I. OT provided education on spinal precautions, home set-up to maximize safety and independence with self-care tasks, and acquisition/use of AE. Patient expressed verbal understanding. Patient does not require continued acute occupational therapy services with OT to sign off at this time.       Follow Up Recommendations  No OT follow up    Equipment Recommendations  None recommended by OT    Recommendations for Other Services       Precautions / Restrictions Precautions Precautions: Back Precaution Booklet Issued: Yes (comment) Precaution Comments: Written/verbal education proivded; able to recall 3/3 back precautions Required Braces or Orthoses:  (No brace needed) Restrictions Weight Bearing Restrictions: No      Mobility Bed Mobility Overal bed mobility: Modified Independent                  Transfers Overall transfer level: Modified independent Equipment used: None             General transfer comment: Increased time to rise secondary to soreness at incision    Balance Overall balance assessment: No apparent balance deficits (not formally assessed)                                         ADL either performed or assessed with clinical judgement   ADL Overall  ADL's : Independent                                             Vision Baseline Vision/History: 1 Wears glasses Ability to See in Adequate Light: 0 Adequate Patient Visual Report: No change from baseline       Perception     Praxis      Pertinent Vitals/Pain Pain Assessment: 0-10 Pain Score: 5  Pain Location: Low back (incisional) Pain Descriptors / Indicators: Aching;Sore Pain Intervention(s): Limited activity within patient's tolerance;Monitored during session;Premedicated before session;Repositioned     Hand Dominance     Extremity/Trunk Assessment Upper Extremity Assessment Upper Extremity Assessment: Overall WFL for tasks assessed   Lower Extremity Assessment Lower Extremity Assessment: Defer to PT evaluation   Cervical / Trunk Assessment Cervical / Trunk Assessment: Other exceptions Cervical / Trunk Exceptions: s/p lumbar surgery   Communication Communication Communication: No difficulties   Cognition Arousal/Alertness: Awake/alert Behavior During Therapy: WFL for tasks assessed/performed Overall Cognitive Status: Within Functional Limits for tasks assessed                                     General Comments  Clean, dry dressing at incision.    Exercises     Shoulder Instructions  Home Living Family/patient expects to be discharged to:: Private residence Living Arrangements: Spouse/significant other Available Help at Discharge: Family Type of Home: House Home Access: Stairs to enter Technical brewer of Steps: 2-3 Entrance Stairs-Rails: None Home Layout: One level     Bathroom Shower/Tub: Teacher, early years/pre: Handicapped height     Home Equipment: None          Prior Functioning/Environment Level of Independence: Independent        Comments: I with ADLs/IADLs; drives; works full-time in [employer] farm [employer]        OT Problem List:        OT Treatment/Interventions:      OT  Goals(Current goals can be found in the care plan section) Acute Rehab OT Goals Patient Stated Goal: To return home. OT Goal Formulation: With patient  OT Frequency:     Barriers to D/C:            Co-evaluation              AM-PAC OT "6 Clicks" Daily Activity     Outcome Measure Help from another person eating meals?: None Help from another person taking care of personal grooming?: None Help from another person toileting, which includes using toliet, bedpan, or urinal?: None Help from another person bathing (including washing, rinsing, drying)?: None Help from another person to put on and taking off regular upper body clothing?: None Help from another person to put on and taking off regular lower body clothing?: None 6 Click Score: 24   End of Session Nurse Communication: Mobility status  Activity Tolerance: Patient tolerated treatment well Patient left: in bed;with call bell/phone within reach  OT Visit Diagnosis: Muscle weakness (generalized) (M62.81)                Time: [id number] OT Time Calculation (min): 20 min Charges:  OT General Charges $OT Visit: 1 Visit OT Evaluation $OT Eval Low Complexity: 1 Low  Kjerstin Abrigo H. OTR/L Supplemental OT, Department of rehab services [phone]  Mattias Walmsley R H. 10/20/2020, 1:03 PM

## 2020-10-20 NOTE — Anesthesia Preprocedure Evaluation (Signed)
Anesthesia Evaluation  Patient identified by MRN, date of birth, ID band Patient awake    Reviewed: Allergy & Precautions, NPO status , Patient's Chart, lab work & pertinent test results  Airway Mallampati: II  TM Distance: >3 FB Neck ROM: Full    Dental  (+) Dental Advisory Given   Pulmonary former smoker,    breath sounds clear to auscultation       Cardiovascular hypertension, Pt. on medications  Rhythm:Regular Rate:Normal     Neuro/Psych negative neurological ROS     GI/Hepatic negative GI ROS, Neg liver ROS,   Endo/Other  negative endocrine ROS  Renal/GU negative Renal ROS     Musculoskeletal   Abdominal   Peds  Hematology negative hematology ROS (+)   Anesthesia Other Findings   Reproductive/Obstetrics                             Lab Results  Component Value Date   WBC 6.9 10/13/2020   HGB 16.3 10/13/2020   HCT 45.4 10/13/2020   MCV 87.5 10/13/2020   PLT 204 10/13/2020   Lab Results  Component Value Date   CREATININE 1.00 10/13/2020   BUN 18 10/13/2020   NA 138 10/13/2020   K 3.0 (L) 10/13/2020   CL 99 10/13/2020   CO2 30 10/13/2020    Anesthesia Physical Anesthesia Plan  ASA: 2  Anesthesia Plan: General   Post-op Pain Management:    Induction: Intravenous  PONV Risk Score and Plan: 2 and Dexamethasone, Ondansetron and Treatment may vary due to age or medical condition  Airway Management Planned: Oral ETT  Additional Equipment: None  Intra-op Plan:   Post-operative Plan: Extubation in OR  Informed Consent: I have reviewed the patients History and Physical, chart, labs and discussed the procedure including the risks, benefits and alternatives for the proposed anesthesia with the patient or authorized representative who has indicated his/her understanding and acceptance.     Dental advisory given  Plan Discussed with: CRNA  Anesthesia Plan Comments:          Anesthesia Quick Evaluation

## 2020-10-20 NOTE — Anesthesia Postprocedure Evaluation (Signed)
Anesthesia Post Note  Patient: Gary Hurst  Procedure(s) Performed: Microlumbar decompression Lumbar three-four, Lumbar four-five     Patient location during evaluation: PACU Anesthesia Type: General Level of consciousness: awake and alert Pain management: pain level controlled Vital Signs Assessment: post-procedure vital signs reviewed and stable Respiratory status: spontaneous breathing, nonlabored ventilation, respiratory function stable and patient connected to nasal cannula oxygen Cardiovascular status: blood pressure returned to baseline and stable Postop Assessment: no apparent nausea or vomiting Anesthetic complications: no   No notable events documented.  Last Vitals:  Vitals:   10/20/20 1146 10/20/20 1649  BP: 131/80 120/67  Pulse: 60 66  Resp: 16 17  Temp:  36.6 C  SpO2: 97% 96%    Last Pain:  Vitals:   10/20/20 1649  TempSrc: Oral  PainSc:                  Tiajuana Amass

## 2020-10-21 ENCOUNTER — Encounter (HOSPITAL_COMMUNITY): Payer: Self-pay | Admitting: Specialist

## 2020-10-21 DIAGNOSIS — M48061 Spinal stenosis, lumbar region without neurogenic claudication: Secondary | ICD-10-CM | POA: Diagnosis not present

## 2020-10-21 LAB — COMPREHENSIVE METABOLIC PANEL
ALT: 23 U/L (ref 0–44)
AST: 19 U/L (ref 15–41)
Albumin: 3.5 g/dL (ref 3.5–5.0)
Alkaline Phosphatase: 41 U/L (ref 38–126)
Anion gap: 10 (ref 5–15)
BUN: 19 mg/dL (ref 8–23)
CO2: 28 mmol/L (ref 22–32)
Calcium: 9.1 mg/dL (ref 8.9–10.3)
Chloride: 99 mmol/L (ref 98–111)
Creatinine, Ser: 1.16 mg/dL (ref 0.61–1.24)
GFR, Estimated: >60 mL/min (ref >60)
Glucose, Bld: 136 mg/dL — ABNORMAL HIGH (ref 70–99)
Potassium: 3.2 mmol/L — ABNORMAL LOW (ref 3.5–5.1)
Sodium: 137 mmol/L (ref 135–145)
Total Bilirubin: 0.8 mg/dL (ref 0.3–1.2)
Total Protein: 5.5 g/dL — ABNORMAL LOW (ref 6.5–8.1)

## 2020-10-21 NOTE — Progress Notes (Signed)
Patient is discharged from room 3C07 at this time. Alert and in stable condition. IV site d/c'd and instructions read to patient with understanding verbalized and all questions answered. Left unit via wheelchair with all belongings at side. 

## 2020-10-21 NOTE — Plan of Care (Signed)
Adequately Ready for Discharge 

## 2020-10-21 NOTE — Progress Notes (Addendum)
Subjective: 1 Day Post-Op Procedure(s) (LRB): Microlumbar decompression Lumbar three-four, Lumbar four-five (N/A) Patient reports pain as moderate.  Reports incisional back pain, leg pain improved. Walking comfortably. Voiding without difficulty. + flatus no BM yet. Feels ready to go home,   Objective: Vital signs in last 24 hours: Temp:  [97.7 F (36.5 C)-98.2 F (36.8 C)] 97.7 F (36.5 C) (08/26 0749) Pulse Rate:  [51-74] 51 (08/26 0749) Resp:  [10-23] 16 (08/26 0749) BP: (117-138)/(66-81) 135/72 (08/26 0749) SpO2:  [94 %-99 %] 96 % (08/26 0749)  Intake/Output from previous day: 08/25 0701 - 08/26 0700 In: 1300 [I.V.:1000; IV Piggyback:300] Out: 535 [Urine:275; Drains:60; Blood:200] Intake/Output this shift: No intake/output data recorded.  No results for input(s): HGB in the last 72 hours. No results for input(s): WBC, RBC, HCT, PLT in the last 72 hours. Recent Labs    10/21/20 0219  NA 137  K 3.2*  CL 99  CO2 28  BUN 19  CREATININE 1.16  GLUCOSE 136*  CALCIUM 9.1   No results for input(s): LABPT, INR in the last 72 hours.  Neurologically intact ABD soft Neurovascular intact Sensation intact distally Intact pulses distally Dorsiflexion/Plantar flexion intact Incision: dressing C/D/I and no drainage No cellulitis present Compartment soft No calf pain or sign of DVT   Assessment/Plan: 1 Day Post-Op Procedure(s) (LRB): Microlumbar decompression Lumbar three-four, Lumbar four-five (N/A) Advance diet Up with therapy D/C IV fluids D/C drain Discussed D/C instructions, dressing instructions, Lspine precautions Plan D/C home today   Cecilie Kicks 10/21/2020, 9:59 AM

## 2020-10-21 NOTE — Evaluation (Signed)
Physical Therapy Evaluation and Discharge Patient Details Name: Gary Hurst MRN: [id number] DOB: 09-Jul-1950 Today's Date: 10/21/2020   History of Present Illness  Pt is a 70 y/o male presenting for elective L3-L5 lumbar decompression secondary to spinal stenosis on 10/20/2020. PMH significant for skin cancer and HTN.   Clinical Impression  Patient evaluated by Physical Therapy with no further acute PT needs identified. All education has been completed and the patient has no further questions. Pt was able to demonstrate transfers and ambulation with gross modified independence and no AD. Pt was educated on precautions, positioning recommendations, appropriate activity progression, and car transfer. See below for any follow-up Physical Therapy or equipment needs. PT is signing off. Thank you for this referral.     Follow Up Recommendations No PT follow up;Supervision - Intermittent    Equipment Recommendations  3in1 (PT)    Recommendations for Other Services       Precautions / Restrictions Precautions Precautions: Back;Fall Precaution Booklet Issued: Yes (comment) Precaution Comments: Reviewed handout and pt was cued for precautions during functional mobility. Required Braces or Orthoses:  (No brace needed order) Restrictions Weight Bearing Restrictions: No      Mobility  Bed Mobility Overal bed mobility: Modified Independent                  Transfers Overall transfer level: Modified independent Equipment used: None             General transfer comment: Pt demonstrated good posture with sit<>stand. No assist required.  Ambulation/Gait Ambulation/Gait assistance: Modified independent (Device/Increase time) Gait Distance (Feet): 560 Feet Assistive device: None Gait Pattern/deviations: Step-through pattern;Decreased stride length Gait velocity: Decreased Gait velocity interpretation: >2.62 ft/sec, indicative of community ambulatory General Gait Details:  Overall good posture and no overt LOB noted throughout. Mildly decreased gait speed and generally steady.  Stairs            Wheelchair Mobility    Modified Rankin (Stroke Patients Only)       Balance Overall balance assessment: No apparent balance deficits (not formally assessed)                                           Pertinent Vitals/Pain Pain Assessment: Faces Faces Pain Scale: Hurts a little bit Pain Location: Low back (incisional) Pain Descriptors / Indicators: Sore;Operative site guarding Pain Intervention(s): Limited activity within patient's tolerance;Monitored during session;Repositioned    Home Living Family/patient expects to be discharged to:: Private residence Living Arrangements: Spouse/significant other Available Help at Discharge: Family Type of Home: House Home Access: Stairs to enter Entrance Stairs-Rails: None Technical brewer of Steps: 2-3 Home Layout: One level Home Equipment: None      Prior Function Level of Independence: Independent         Comments: I with ADLs/IADLs; drives; 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 [employer]        Extremity/Trunk Assessment   Upper Extremity Assessment Upper Extremity Assessment: Defer to OT evaluation    Lower Extremity Assessment Lower Extremity Assessment: Generalized weakness (Mild; consistent with pre-op diagnosis)    Cervical / Trunk Assessment Cervical / Trunk Assessment: Other exceptions Cervical / Trunk Exceptions: s/p lumbar surgery  Communication   Communication: No difficulties  Cognition Arousal/Alertness: Awake/alert Behavior During Therapy: WFL for tasks assessed/performed Overall Cognitive Status: Within Functional Limits for tasks assessed  General Comments      Exercises     Assessment/Plan    PT Assessment Patent does not need any further PT services  PT Problem List          PT Treatment Interventions      PT Goals (Current goals can be found in the Care Plan section)  Acute Rehab PT Goals Patient Stated Goal: Return to work middle of September PT Goal Formulation: All assessment and education complete, DC therapy    Frequency     Barriers to discharge        Co-evaluation               AM-PAC PT "6 Clicks" Mobility  Outcome Measure Help needed turning from your back to your side while in a flat bed without using bedrails?: None Help needed moving from lying on your back to sitting on the side of a flat bed without using bedrails?: None Help needed moving to and from a bed to a chair (including a wheelchair)?: None Help needed standing up from a chair using your arms (e.g., wheelchair or bedside chair)?: None Help needed to walk in hospital room?: None Help needed climbing 3-5 steps with a railing? : None 6 Click Score: 24    End of Session   Activity Tolerance: Patient tolerated treatment well Patient left: in chair;with call bell/phone within reach Nurse Communication: Mobility status PT Visit Diagnosis: Unsteadiness on feet (R26.81);Pain Pain - part of body:  (back)    Time: [id number] PT Time Calculation (min) (ACUTE ONLY): 18 min   Charges:   PT Evaluation $PT Eval Low Complexity: 1 Low          Rolinda Roan, PT, DPT Acute Rehabilitation Services Pager: [phone] Office: [phone]   Thelma Comp 10/21/2020, 9:51 AM

## 2021-07-11 ENCOUNTER — Encounter: Payer: Self-pay | Admitting: Dermatology

## 2021-07-11 ENCOUNTER — Ambulatory Visit (INDEPENDENT_AMBULATORY_CARE_PROVIDER_SITE_OTHER): Payer: Medicare Other | Admitting: Dermatology

## 2021-07-11 DIAGNOSIS — C44712 Basal cell carcinoma of skin of right lower limb, including hip: Secondary | ICD-10-CM

## 2021-07-11 DIAGNOSIS — Z1283 Encounter for screening for malignant neoplasm of skin: Secondary | ICD-10-CM

## 2021-07-11 DIAGNOSIS — L820 Inflamed seborrheic keratosis: Secondary | ICD-10-CM

## 2021-07-11 DIAGNOSIS — L237 Allergic contact dermatitis due to plants, except food: Secondary | ICD-10-CM | POA: Diagnosis not present

## 2021-07-11 DIAGNOSIS — Z86018 Personal history of other benign neoplasm: Secondary | ICD-10-CM

## 2021-07-11 DIAGNOSIS — Z85828 Personal history of other malignant neoplasm of skin: Secondary | ICD-10-CM | POA: Diagnosis not present

## 2021-07-11 DIAGNOSIS — D485 Neoplasm of uncertain behavior of skin: Secondary | ICD-10-CM

## 2021-07-11 DIAGNOSIS — D045 Carcinoma in situ of skin of trunk: Secondary | ICD-10-CM | POA: Diagnosis not present

## 2021-07-11 MED ORDER — CLOBETASOL PROP EMOLLIENT BASE 0.05 % EX CREA: TOPICAL_CREAM | CUTANEOUS | 11 refills | Status: AC

## 2021-07-11 MED ORDER — MUPIROCIN 2 % EX OINT: 1.0000 "application " | TOPICAL_OINTMENT | Freq: Every day | CUTANEOUS | 0 refills | Status: AC

## 2021-07-11 NOTE — Patient Instructions (Signed)

## 2021-07-13 ENCOUNTER — Telehealth: Payer: Self-pay

## 2021-07-13 NOTE — Telephone Encounter (Signed)
-----   Message from Lavonna Monarch, MD sent at 07/13/2021  6:52 AM EDT ----- Schedule surgery with Dr. Darene Lamer

## 2021-07-13 NOTE — Telephone Encounter (Signed)
Phone call to patient with his pathology results. Patient aware of results.  

## 2021-07-31 ENCOUNTER — Encounter: Payer: Self-pay | Admitting: Dermatology

## 2021-07-31 NOTE — Progress Notes (Signed)
Follow-Up Visit   Subjective  Gary Hurst is a 71 y.o. male who presents for the following: Annual Exam (Patient here today for yearly skin check, no concerns today. Personal history of atypical mole and non mole skin cancer. No family history of atypical moles, melanoma or non mole skin cancer. ).  General skin examination, several spots with history of growth that concern patient Location:  Duration:  Quality:  Associated Signs/Symptoms: Modifying Factors:  Severity:  Timing: Context:   Objective  Well appearing patient in no apparent distress; mood and affect are within normal limits. General skin examination.  No atypical pigmented lesions.  For possible nonmelanoma skin cancers will be biopsied today.  Right Breast 1 cm waxy pink lesion       Left Lower Back Inflamed brown 1.5 cm crust       Left Mid Back Inflamed 6 mm crust       Right Inner Lower Leg - Anterior Eroded 1 cm waxy pink crust       Right Forearm - Anterior Linear subacute dermatitis    A full examination was performed including scalp, head, eyes, ears, nose, lips, neck, chest, axillae, abdomen, back, buttocks, bilateral upper extremities, bilateral lower extremities, hands, feet, fingers, toes, fingernails, and toenails. All findings within normal limits unless otherwise noted below.   Assessment & Plan    Neoplasm of uncertain behavior of skin (4) Right Breast  Skin / nail biopsy Type of biopsy: tangential   Informed consent: discussed and consent obtained   Timeout: patient name, date of birth, surgical site, and procedure verified   Procedure prep:  Patient was prepped and draped in usual sterile fashion (Non sterile) Prep type:  Chlorhexidine Anesthesia: the lesion was anesthetized in a standard fashion   Anesthetic:  1% lidocaine w/ epinephrine 1-100,000 local infiltration Instrument used: flexible razor blade   Hemostasis achieved with: ferric subsulfate    Outcome: patient tolerated procedure well   Post-procedure details: sterile dressing applied and wound care instructions given   Dressing type: bandage and petrolatum    mupirocin ointment (BACTROBAN) 2 % Apply 1 application. topically daily.  Specimen 1 - Surgical pathology Differential Diagnosis: R/O BCC vs SCC  Check Margins: No  Left Lower Back  Skin / nail biopsy Type of biopsy: tangential   Informed consent: discussed and consent obtained   Timeout: patient name, date of birth, surgical site, and procedure verified   Anesthesia: the lesion was anesthetized in a standard fashion   Anesthetic:  1% lidocaine w/ epinephrine 1-100,000 local infiltration Instrument used: flexible razor blade   Hemostasis achieved with: ferric subsulfate   Outcome: patient tolerated procedure well   Post-procedure details: sterile dressing applied and wound care instructions given   Dressing type: bandage and petrolatum    mupirocin ointment (BACTROBAN) 2 % Apply 1 application. topically daily.  Specimen 2 - Surgical pathology Differential Diagnosis: R/O BCC vs SCC  Check Margins: No  Left Mid Back  Skin / nail biopsy Type of biopsy: tangential   Informed consent: discussed and consent obtained   Timeout: patient name, date of birth, surgical site, and procedure verified   Procedure prep:  Patient was prepped and draped in usual sterile fashion (Non sterile) Prep type:  Chlorhexidine Anesthesia: the lesion was anesthetized in a standard fashion   Anesthetic:  1% lidocaine w/ epinephrine 1-100,000 local infiltration Instrument used: flexible razor blade   Hemostasis achieved with: ferric subsulfate   Outcome: patient tolerated procedure well  Post-procedure details: sterile dressing applied and wound care instructions given   Dressing type: bandage and petrolatum    mupirocin ointment (BACTROBAN) 2 % Apply 1 application. topically daily.  Specimen 3 - Surgical  pathology Differential Diagnosis: R/O BCC vs SCC  Check Margins: No  Right Inner Lower Leg - Anterior  Skin / nail biopsy Type of biopsy: tangential   Informed consent: discussed and consent obtained   Timeout: patient name, date of birth, surgical site, and procedure verified   Procedure prep:  Patient was prepped and draped in usual sterile fashion (Non sterile) Prep type:  Chlorhexidine Anesthesia: the lesion was anesthetized in a standard fashion   Anesthetic:  1% lidocaine w/ epinephrine 1-100,000 local infiltration Instrument used: flexible razor blade   Hemostasis achieved with: ferric subsulfate   Outcome: patient tolerated procedure well   Post-procedure details: sterile dressing applied and wound care instructions given   Dressing type: pressure dressing and petrolatum    mupirocin ointment (BACTROBAN) 2 % Apply 1 application. topically daily.  Specimen 4 - Surgical pathology Differential Diagnosis: R/O BCC vs SCC  Check Margins: No  Poison ivy Right Forearm - Anterior  Apply topical clobetasol once or twice daily for the next 7 to 10 days.  Call if this is not clearing.  Clobetasol Prop Emollient Base 0.05 % emollient cream - Right Forearm - Anterior Apply topically 2 (two) times a day if needed (Rash).  Encounter for screening for malignant neoplasm of skin  Annual skin examination.      I, Lavonna Monarch, MD, have reviewed all documentation for this visit.  The documentation on 07/31/21 for the exam, diagnosis, procedures, and orders are all accurate and complete.

## 2021-08-17 ENCOUNTER — Encounter: Payer: Self-pay | Admitting: Dermatology

## 2021-08-17 ENCOUNTER — Ambulatory Visit (INDEPENDENT_AMBULATORY_CARE_PROVIDER_SITE_OTHER): Payer: Medicare Other | Admitting: Dermatology

## 2021-08-17 DIAGNOSIS — C44519 Basal cell carcinoma of skin of other part of trunk: Secondary | ICD-10-CM | POA: Diagnosis not present

## 2021-08-17 DIAGNOSIS — C4491 Basal cell carcinoma of skin, unspecified: Secondary | ICD-10-CM

## 2021-08-17 DIAGNOSIS — C44529 Squamous cell carcinoma of skin of other part of trunk: Secondary | ICD-10-CM | POA: Diagnosis not present

## 2021-08-17 NOTE — Patient Instructions (Addendum)
  [facility] new dx for nails    Biopsy, Surgery (Curettage) & Surgery (Excision) Aftercare Instructions  1. Okay to remove bandage in 24 hours  2. Wash area with soap and water  3. Apply Vaseline to area twice daily until healed (Not Neosporin)  4. Okay to cover with a Band-Aid to decrease the chance of infection or prevent irritation from clothing; also it's okay to uncover lesion at home.  5. Suture instructions: return to our office in 7-10 or 10-14 days for a nurse visit for suture removal. Variable healing with sutures, if pain or itching occurs call our office. It's okay to shower or bathe 24 hours after sutures are given.  6. The following risks may occur after a biopsy, curettage or excision: bleeding, scarring, discoloration, recurrence, infection (redness, yellow drainage, pain or swelling).  7. For questions, concerns and results call our office at [place] before 4pm & Friday before 3pm. Biopsy results will be available in 1 week.

## 2021-09-04 ENCOUNTER — Encounter: Payer: Self-pay | Admitting: Dermatology

## 2021-09-04 NOTE — Progress Notes (Signed)
   Follow-Up Visit   Subjective  Gary Hurst is a 71 y.o. male who presents for the following: Procedure (Patient here today for treatment on CIS x 1 right breast and BCC x 1 right inner lower leg- anterior).  Biopsy-proven nonmelanoma skin cancers chest and right leg Location:  Duration:  Quality:  Associated Signs/Symptoms: Modifying Factors:  Severity:  Timing: Context:   Objective  Well appearing patient in no apparent distress; mood and affect are within normal limits. right inner lower leg Lesion identified by Dr.Yuta Cipollone and nurse in room.    Right Breast Lesion identified by Dr.Gavriella Hearst and nurse in room.      All skin waist up examined.  Plus leg.   Assessment & Plan    Nodular basal cell carcinoma (BCC) right inner lower leg  Destruction of lesion Complexity: simple   Destruction method: electrodesiccation and curettage   Informed consent: discussed and consent obtained   Timeout:  patient name, date of birth, surgical site, and procedure verified Anesthesia: the lesion was anesthetized in a standard fashion   Anesthetic:  1% lidocaine w/ epinephrine 1-100,000 local infiltration Curettage performed in three different directions: Yes   Curettage cycles:  3 Lesion length (cm):  2.2 Lesion width (cm):  2.2 Margin per side (cm):  0 Final wound size (cm):  2.2 Hemostasis achieved with:  aluminum chloride Outcome: patient tolerated procedure well with no complications   Post-procedure details: wound care instructions given    Squamous cell carcinoma of skin of other part of trunk Right Breast  Destruction of lesion Complexity: simple   Destruction method: electrodesiccation and curettage   Informed consent: discussed and consent obtained   Timeout:  patient name, date of birth, surgical site, and procedure verified Anesthesia: the lesion was anesthetized in a standard fashion   Anesthetic:  1% lidocaine w/ epinephrine 1-100,000 local  infiltration Curettage performed in three different directions: Yes   Electrodesiccation performed over the curetted area: Yes   Curettage cycles:  3 Lesion length (cm):  1.5 Lesion width (cm):  1.5 Margin per side (cm):  0 Final wound size (cm):  1.5 Hemostasis achieved with:  aluminum chloride Outcome: patient tolerated procedure well with no complications   Post-procedure details: wound care instructions given        I, Lavonna Monarch, MD, have reviewed all documentation for this visit.  The documentation on 09/04/21 for the exam, diagnosis, procedures, and orders are all accurate and complete.

## 2021-09-07 ENCOUNTER — Encounter: Payer: Medicare Other | Admitting: Dermatology
